# Patient Record
Sex: Male | Born: 1965
Health system: Southern US, Community
[De-identification: ages and names within clinical notes are randomized; demographics above are authoritative.]

## PROBLEM LIST (undated history)

## (undated) DIAGNOSIS — G8929 Other chronic pain: Secondary | ICD-10-CM

## (undated) DIAGNOSIS — J449 Chronic obstructive pulmonary disease, unspecified: Secondary | ICD-10-CM

## (undated) DIAGNOSIS — J439 Emphysema, unspecified: Secondary | ICD-10-CM

## (undated) DIAGNOSIS — G894 Chronic pain syndrome: Secondary | ICD-10-CM

## (undated) DIAGNOSIS — Z8701 Personal history of pneumonia (recurrent): Secondary | ICD-10-CM

## (undated) DIAGNOSIS — M1990 Unspecified osteoarthritis, unspecified site: Secondary | ICD-10-CM

## (undated) DIAGNOSIS — J42 Unspecified chronic bronchitis: Secondary | ICD-10-CM

## (undated) HISTORY — DX: Personal history of pneumonia (recurrent): Z87.01

---
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
## 2006-10-28 ENCOUNTER — Ambulatory Visit: Payer: Self-pay | Admitting: Family Medicine

## 2007-05-26 LAB — PSA: PSA: 0.6

## 2007-07-05 ENCOUNTER — Ambulatory Visit: Payer: Self-pay | Admitting: Rheumatology

## 2007-07-05 DIAGNOSIS — M5020 Other cervical disc displacement, unspecified cervical region: Secondary | ICD-10-CM | POA: Insufficient documentation

## 2007-07-05 HISTORY — PX: OTHER SURGICAL HISTORY: SHX169

## 2007-08-05 ENCOUNTER — Ambulatory Visit: Payer: Self-pay | Admitting: Vascular Surgery

## 2009-08-13 HISTORY — PX: OTHER SURGICAL HISTORY: SHX169

## 2011-09-29 LAB — LIPID PANEL
Cholesterol: 166 mg/dL (ref 0–200)
HDL: 30 mg/dL — AB (ref 35–70)
LDL Cholesterol: 87 mg/dL
Triglycerides: 246 mg/dL — AB (ref 40–160)

## 2011-09-29 LAB — HEPATIC FUNCTION PANEL: ALT: 46 U/L — AB (ref 10–40)

## 2012-07-28 ENCOUNTER — Ambulatory Visit (HOSPITAL_COMMUNITY)
Admission: RE | Admit: 2012-07-28 | Discharge: 2012-07-28 | Disposition: A | Discharge status: Home / Self Care | Payer: BC Managed Care – PPO | Source: Ambulatory Visit | Attending: Rheumatology | Admitting: Rheumatology

## 2012-07-28 ENCOUNTER — Other Ambulatory Visit (HOSPITAL_COMMUNITY): Payer: Self-pay | Admitting: Rheumatology

## 2012-07-28 DIAGNOSIS — R0602 Shortness of breath: Secondary | ICD-10-CM | POA: Insufficient documentation

## 2012-07-28 DIAGNOSIS — D869 Sarcoidosis, unspecified: Secondary | ICD-10-CM

## 2012-11-17 ENCOUNTER — Emergency Department: Payer: Self-pay | Admitting: Emergency Medicine

## 2013-11-14 ENCOUNTER — Other Ambulatory Visit: Payer: Self-pay | Admitting: Rehabilitation

## 2013-11-14 DIAGNOSIS — M25512 Pain in left shoulder: Principal | ICD-10-CM

## 2013-11-14 DIAGNOSIS — M25511 Pain in right shoulder: Secondary | ICD-10-CM

## 2013-11-23 ENCOUNTER — Ambulatory Visit
Admission: RE | Admit: 2013-11-23 | Discharge: 2013-11-23 | Disposition: A | Discharge status: Home / Self Care | Payer: BC Managed Care – PPO | Source: Ambulatory Visit | Attending: Rehabilitation | Admitting: Rehabilitation

## 2013-11-23 DIAGNOSIS — M25511 Pain in right shoulder: Secondary | ICD-10-CM

## 2013-11-23 DIAGNOSIS — M25512 Pain in left shoulder: Principal | ICD-10-CM

## 2013-12-18 ENCOUNTER — Emergency Department: Payer: Self-pay | Admitting: Emergency Medicine

## 2013-12-18 LAB — CBC
HCT: 43.6 % (ref 40.0–52.0)
HGB: 15 g/dL (ref 13.0–18.0)
MCH: 31.2 pg (ref 26.0–34.0)
MCHC: 34.3 g/dL (ref 32.0–36.0)
MCV: 91 fL (ref 80–100)
PLATELETS: 301 10*3/uL (ref 150–440)
RBC: 4.8 10*6/uL (ref 4.40–5.90)
RDW: 14.3 % (ref 11.5–14.5)
WBC: 12 10*3/uL — AB (ref 3.8–10.6)

## 2013-12-18 LAB — COMPREHENSIVE METABOLIC PANEL
ALBUMIN: 3.9 g/dL (ref 3.4–5.0)
AST: 17 U/L (ref 15–37)
Alkaline Phosphatase: 83 U/L
Anion Gap: 5 — ABNORMAL LOW (ref 7–16)
BUN: 12 mg/dL (ref 7–18)
Bilirubin,Total: 0.3 mg/dL (ref 0.2–1.0)
CALCIUM: 8.8 mg/dL (ref 8.5–10.1)
CHLORIDE: 106 mmol/L (ref 98–107)
CO2: 28 mmol/L (ref 21–32)
Creatinine: 0.79 mg/dL (ref 0.60–1.30)
EGFR (Non-African Amer.): >60
GLUCOSE: 104 mg/dL — AB (ref 65–99)
OSMOLALITY: 278 (ref 275–301)
Potassium: 3.7 mmol/L (ref 3.5–5.1)
SGPT (ALT): 33 U/L
Sodium: 139 mmol/L (ref 136–145)
Total Protein: 7.7 g/dL (ref 6.4–8.2)

## 2013-12-18 LAB — LIPASE, BLOOD: Lipase: 57 U/L — ABNORMAL LOW (ref 73–393)

## 2013-12-22 ENCOUNTER — Other Ambulatory Visit: Payer: Self-pay | Admitting: Orthopaedic Surgery

## 2013-12-22 DIAGNOSIS — M542 Cervicalgia: Secondary | ICD-10-CM

## 2014-01-03 ENCOUNTER — Ambulatory Visit
Admission: RE | Admit: 2014-01-03 | Discharge: 2014-01-03 | Disposition: A | Discharge status: Home / Self Care | Payer: BC Managed Care – PPO | Source: Ambulatory Visit | Attending: Orthopaedic Surgery | Admitting: Orthopaedic Surgery

## 2014-01-03 DIAGNOSIS — M542 Cervicalgia: Secondary | ICD-10-CM

## 2014-01-04 ENCOUNTER — Other Ambulatory Visit: Payer: BC Managed Care – PPO

## 2014-01-09 ENCOUNTER — Other Ambulatory Visit: Payer: Self-pay | Admitting: Orthopaedic Surgery

## 2014-01-09 DIAGNOSIS — M545 Low back pain: Secondary | ICD-10-CM

## 2014-01-16 ENCOUNTER — Ambulatory Visit
Admission: RE | Admit: 2014-01-16 | Discharge: 2014-01-16 | Disposition: A | Discharge status: Home / Self Care | Payer: BC Managed Care – PPO | Source: Ambulatory Visit | Attending: Orthopaedic Surgery | Admitting: Orthopaedic Surgery

## 2014-01-16 DIAGNOSIS — M545 Low back pain: Secondary | ICD-10-CM

## 2014-05-19 ENCOUNTER — Ambulatory Visit
Admit: 2014-05-19 | Disposition: A | Discharge status: Home / Self Care | Payer: Self-pay | Attending: Family Medicine | Admitting: Family Medicine

## 2014-10-26 DIAGNOSIS — F17210 Nicotine dependence, cigarettes, uncomplicated: Secondary | ICD-10-CM | POA: Insufficient documentation

## 2014-10-26 DIAGNOSIS — G4700 Insomnia, unspecified: Secondary | ICD-10-CM | POA: Insufficient documentation

## 2014-10-26 DIAGNOSIS — M545 Low back pain, unspecified: Secondary | ICD-10-CM | POA: Insufficient documentation

## 2014-10-26 DIAGNOSIS — Z720 Tobacco use: Secondary | ICD-10-CM

## 2014-10-26 DIAGNOSIS — Z8701 Personal history of pneumonia (recurrent): Secondary | ICD-10-CM

## 2014-10-26 DIAGNOSIS — D66 Hereditary factor VIII deficiency: Secondary | ICD-10-CM

## 2014-10-26 DIAGNOSIS — E782 Mixed hyperlipidemia: Secondary | ICD-10-CM

## 2014-10-26 DIAGNOSIS — K6289 Other specified diseases of anus and rectum: Secondary | ICD-10-CM

## 2014-10-26 DIAGNOSIS — R911 Solitary pulmonary nodule: Secondary | ICD-10-CM | POA: Insufficient documentation

## 2014-10-26 DIAGNOSIS — N529 Male erectile dysfunction, unspecified: Secondary | ICD-10-CM | POA: Insufficient documentation

## 2014-10-26 HISTORY — DX: Personal history of pneumonia (recurrent): Z87.01

## 2014-10-27 ENCOUNTER — Ambulatory Visit (INDEPENDENT_AMBULATORY_CARE_PROVIDER_SITE_OTHER): Payer: BLUE CROSS/BLUE SHIELD | Admitting: Family Medicine

## 2014-10-27 ENCOUNTER — Encounter: Payer: Self-pay | Admitting: Family Medicine

## 2014-10-27 ENCOUNTER — Telehealth: Payer: Self-pay

## 2014-10-27 ENCOUNTER — Other Ambulatory Visit: Payer: Self-pay | Admitting: Family Medicine

## 2014-10-27 VITALS — BP 110/60 | HR 79 | Temp 98.8°F | Resp 16 | Wt 170.0 lb

## 2014-10-27 DIAGNOSIS — R591 Generalized enlarged lymph nodes: Secondary | ICD-10-CM

## 2014-10-27 DIAGNOSIS — R911 Solitary pulmonary nodule: Secondary | ICD-10-CM | POA: Diagnosis not present

## 2014-10-27 DIAGNOSIS — Z8249 Family history of ischemic heart disease and other diseases of the circulatory system: Secondary | ICD-10-CM | POA: Insufficient documentation

## 2014-10-27 MED ORDER — LEVOFLOXACIN 500 MG PO TABS: 500.0000 mg | ORAL_TABLET | Freq: Every day | ORAL | Status: AC

## 2014-10-27 MED ORDER — AMOXICILLIN-POT CLAVULANATE 875-125 MG PO TABS: 1.0000 | ORAL_TABLET | Freq: Two times a day (BID) | ORAL | Status: DC

## 2014-10-27 MED ORDER — PROMETHAZINE HCL 25 MG PO TABS: 25.0000 mg | ORAL_TABLET | Freq: Three times a day (TID) | ORAL | Status: DC | PRN

## 2014-10-27 NOTE — Progress Notes (Signed)
Patient: Jason Gutierrez Male    DOB: October 30, 1965   49 y.o.   MRN: [id number] Visit Date: 10/27/2014  Today's Provider: Mila Merry, MD   Chief Complaint  Patient presents with  . Neck Pain   Subjective:    HPI  Patient has knot on the right front side of his neck starting 2 days ago. Knot is swollen and painful. Also has right ear pain. He had some left over amoxicillin from having tooth pulled in June which he started two days ago. He feels amoxicillin has kept it from getting worse. No other URI. No mouth pain. No fevers.   Allergies  Allergen Reactions  . Levitra [Vardenafil]   . Penicillins   . Vicodin [Hydrocodone-Acetaminophen]    Previous Medications   AMOXICILLIN (AMOXIL) 500 MG CAPSULE    Take 500 mg by mouth every 8 (eight) hours.   ATORVASTATIN (LIPITOR) 40 MG TABLET    Take 40 mg by mouth daily.   CYCLOBENZAPRINE (FLEXERIL) 5 MG TABLET    Take by mouth.   HYDROCODONE-ACETAMINOPHEN (NORCO/VICODIN) 5-325 MG PER TABLET    Take 1 tablet by mouth every 12 (twelve) hours as needed for moderate pain.   SILDENAFIL CITRATE (VIAGRA PO)    Take by mouth.   TADALAFIL (CIALIS) 20 MG TABLET    Take 20 mg by mouth daily as needed for erectile dysfunction.   ZOLPIDEM (AMBIEN) 10 MG TABLET    Take 10 mg by mouth at bedtime as needed for sleep. 0.5-1 tablet  at bed time    Review of Systems  HENT: Positive for ear pain, postnasal drip and sinus pressure. Negative for sore throat.   Cardiovascular: Negative for chest pain and palpitations.  Musculoskeletal: Positive for neck pain.  Neurological: Positive for headaches. Negative for dizziness and light-headedness.    Social History  Substance Use Topics  . Smoking status: Current Every Day Smoker  . Smokeless tobacco: Not on file  . Alcohol Use: 0.0 oz/week    0 Standard drinks or equivalent per week     Comment: occasional   Objective:   BP 110/60 mmHg  Pulse 79  Temp(Src) 98.8 F (37.1 C) (Oral)  Resp 16  Wt  170 lb (77.111 kg)  SpO2 95%  Physical Exam  General Appearance:    Alert, cooperative, no distress  HENT:   right TM fluid noted, neck has right anterior and posterior cervical nodes enlarged and tender, throat normal without erythema or exudate and sinuses nontender. No oral lesions. Gingiva pink.   Eyes:    PERRL, conjunctiva/corneas clear, EOM's intact       Lungs:     Clear to auscultation bilaterally, respirations unlabored  Heart:    Regular rate and rhythm  Neurologic:   Awake, alert, oriented x 3. No apparent focal neurological           defect.          Assessment & Plan:     1. Lymphadenopathy  - amoxicillin-clavulanate (AUGMENTIN) 875-125 MG per tablet; Take 1 tablet by mouth 2 (two) times daily.  Dispense: 20 tablet; Refill: 0 Call if symptoms change or if not rapidly improving.    2. Lung nodule-  Will be due for follow up CT scan in October.   Patient Instructions  Take 3 OTC ibuprofen (  each) every six hours for the next 3-4 days.   Call the last week of October to schedule follow chest CT scan  Lelon Huh, MD  [facility]

## 2014-10-27 NOTE — Patient Instructions (Addendum)
Take 3 OTC ibuprofen (  each) every six hours for the next 3-4 days.   Call the last week of October to schedule follow chest CT scan

## 2014-10-27 NOTE — Telephone Encounter (Signed)
Patient called saying that he was seen in the office this morning and was prescribed Augmentin. He reports that he took his first dose this morning around 9:30, and about an hr later he had diarrhea. He now reports that he has been vomiting since 12:30 today. Patient is requesting that another antibiotic be called into the pharmacy. Patient uses [facility] on [address]. Contact number is correct. Thanks!

## 2014-10-27 NOTE — Telephone Encounter (Signed)
Have sent rx for phenergan to his pharmacy for nausea. He should take antibiotics with food (something bland like bread or toast). If he is having fever or if he is not able to keep the antibiotic then he needs to go to ER as he may need antibiotic shot.

## 2014-10-27 NOTE — Telephone Encounter (Signed)
Advised patient as below. Patient reports that he may go to the ER as he is unable to keep the abx down. Advised to try phenergan as this will help with nausea.

## 2014-10-27 NOTE — Telephone Encounter (Signed)
Have sent rx for levoquin to [facility] to take instead of Augmentin.

## 2014-10-27 NOTE — Telephone Encounter (Signed)
Patient notified. Patient stated that he is still vomiting and now has cold sweats. Patient wanted to know what he can do to prevent nausea?

## 2014-10-30 NOTE — Telephone Encounter (Signed)
Please check with patient to see if he was able to take the antibiotic through the weekend and the soreness and swelling in his neck are improving. Thanks.

## 2014-10-30 NOTE — Telephone Encounter (Signed)
Levaquin is much easier on the stomach than Augmentin, and it works better than any injectable antibiotic that we have. Recommend he start taking the Levaquin.

## 2014-10-30 NOTE — Telephone Encounter (Signed)
Patient advised as below and agrees to try Levaquin. Patient states he would call back if he is unable to tolerate it.

## 2014-10-30 NOTE — Telephone Encounter (Signed)
Called patient. Patient states he did not go to the ER this weekend. He states he stopped taking the Augmentin and symptoms slowly improved. Patient only took 1 dose of the Augmentin. Patient has filled the prescription for Levaquin but has not started taking it yet, Patient is concerned that his stomach will start acting up again. Patient wants to know if there is an injection that we can give him instead of taking the oral antibiotics? Patient states he still has swelling and sorness in his neck that is unchanged. Patient states he has not had any vomiting today and he was able to eat this morning without vomiting it back up.

## 2015-02-06 ENCOUNTER — Telehealth: Payer: Self-pay | Admitting: Family Medicine

## 2015-02-06 ENCOUNTER — Telehealth: Payer: Self-pay | Admitting: *Deleted

## 2015-02-06 DIAGNOSIS — R911 Solitary pulmonary nodule: Secondary | ICD-10-CM

## 2015-02-06 NOTE — Telephone Encounter (Signed)
Patient called office requesting medication for persistent cough and sinus drainage. States no fever or sore throat. Patient took some amoxicillin he had at home and had 5 hour bout of vomiting and diarrhea. Advised pt that amoxicillin is in his allergy list, causing the above reactions. Patient stated that he will throw amoxicillin away. Vomiting and diarrhea have resolved.

## 2015-02-06 NOTE — Telephone Encounter (Signed)
Please advise patient he is due to repeat chest CT due to small nodule seen in right lung on CT in April. Order has been sent to [facility] to schedule. Thanks.

## 2015-02-06 NOTE — Telephone Encounter (Signed)
-----   Message from Malva Limesonald E Brooklee Michelin, MD sent at 01/30/2015  8:11 AM EST ----- Regarding: Need repeat chest CT after October 2016 Follow up new 5mm RML ground-glass opacity on CT from 05/19/14.

## 2015-02-06 NOTE — Telephone Encounter (Signed)
Advised patient as below.  

## 2015-02-07 NOTE — Telephone Encounter (Signed)
Can take OTC Mucinex DM for cough and fexofenadine for drainage. O.v. If any fever above 101, shortness of breath, chest pain, or if symptoms last more than 10 days.

## 2015-02-07 NOTE — Telephone Encounter (Signed)
Patient was notified. Expressed understanding.  

## 2015-02-12 ENCOUNTER — Ambulatory Visit: Payer: BLUE CROSS/BLUE SHIELD

## 2015-02-14 ENCOUNTER — Ambulatory Visit: Admission: RE | Admit: 2015-02-14 | Payer: BLUE CROSS/BLUE SHIELD | Source: Ambulatory Visit

## 2015-02-14 ENCOUNTER — Ambulatory Visit: Payer: BLUE CROSS/BLUE SHIELD | Attending: Family Medicine

## 2015-04-18 ENCOUNTER — Other Ambulatory Visit: Payer: Self-pay | Admitting: Family Medicine

## 2015-05-10 DIAGNOSIS — M5106 Intervertebral disc disorders with myelopathy, lumbar region: Secondary | ICD-10-CM | POA: Diagnosis not present

## 2015-05-10 DIAGNOSIS — M545 Low back pain: Secondary | ICD-10-CM | POA: Diagnosis not present

## 2015-05-10 DIAGNOSIS — G894 Chronic pain syndrome: Secondary | ICD-10-CM | POA: Diagnosis not present

## 2015-05-10 DIAGNOSIS — M542 Cervicalgia: Secondary | ICD-10-CM | POA: Diagnosis not present

## 2015-06-07 DIAGNOSIS — G894 Chronic pain syndrome: Secondary | ICD-10-CM | POA: Diagnosis not present

## 2015-06-07 DIAGNOSIS — M545 Low back pain: Secondary | ICD-10-CM | POA: Diagnosis not present

## 2015-07-06 DIAGNOSIS — M545 Low back pain: Secondary | ICD-10-CM | POA: Diagnosis not present

## 2015-07-06 DIAGNOSIS — G894 Chronic pain syndrome: Secondary | ICD-10-CM | POA: Diagnosis not present

## 2015-08-03 DIAGNOSIS — Z6830 Body mass index (BMI) 30.0-30.9, adult: Secondary | ICD-10-CM | POA: Diagnosis not present

## 2015-08-03 DIAGNOSIS — G894 Chronic pain syndrome: Secondary | ICD-10-CM | POA: Diagnosis not present

## 2015-09-03 DIAGNOSIS — M791 Myalgia: Secondary | ICD-10-CM | POA: Diagnosis not present

## 2015-09-03 DIAGNOSIS — G894 Chronic pain syndrome: Secondary | ICD-10-CM | POA: Diagnosis not present

## 2015-10-02 DIAGNOSIS — M791 Myalgia: Secondary | ICD-10-CM | POA: Diagnosis not present

## 2015-10-02 DIAGNOSIS — M545 Low back pain: Secondary | ICD-10-CM | POA: Diagnosis not present

## 2015-10-02 DIAGNOSIS — G894 Chronic pain syndrome: Secondary | ICD-10-CM | POA: Diagnosis not present

## 2015-10-30 DIAGNOSIS — M791 Myalgia: Secondary | ICD-10-CM | POA: Diagnosis not present

## 2015-10-30 DIAGNOSIS — G894 Chronic pain syndrome: Secondary | ICD-10-CM | POA: Diagnosis not present

## 2015-10-30 DIAGNOSIS — M542 Cervicalgia: Secondary | ICD-10-CM | POA: Diagnosis not present

## 2015-11-27 DIAGNOSIS — G894 Chronic pain syndrome: Secondary | ICD-10-CM | POA: Diagnosis not present

## 2016-01-08 DIAGNOSIS — M545 Low back pain: Secondary | ICD-10-CM | POA: Diagnosis not present

## 2016-01-08 DIAGNOSIS — M4716 Other spondylosis with myelopathy, lumbar region: Secondary | ICD-10-CM | POA: Diagnosis not present

## 2016-01-08 DIAGNOSIS — G894 Chronic pain syndrome: Secondary | ICD-10-CM | POA: Diagnosis not present

## 2016-01-08 DIAGNOSIS — M791 Myalgia: Secondary | ICD-10-CM | POA: Diagnosis not present

## 2016-02-15 DIAGNOSIS — M791 Myalgia: Secondary | ICD-10-CM | POA: Diagnosis not present

## 2016-02-15 DIAGNOSIS — M542 Cervicalgia: Secondary | ICD-10-CM | POA: Diagnosis not present

## 2016-02-15 DIAGNOSIS — G894 Chronic pain syndrome: Secondary | ICD-10-CM | POA: Diagnosis not present

## 2016-02-15 DIAGNOSIS — M545 Low back pain: Secondary | ICD-10-CM | POA: Diagnosis not present

## 2016-03-14 DIAGNOSIS — M4716 Other spondylosis with myelopathy, lumbar region: Secondary | ICD-10-CM | POA: Diagnosis not present

## 2016-03-14 DIAGNOSIS — M545 Low back pain: Secondary | ICD-10-CM | POA: Diagnosis not present

## 2016-03-14 DIAGNOSIS — G894 Chronic pain syndrome: Secondary | ICD-10-CM | POA: Diagnosis not present

## 2016-03-14 DIAGNOSIS — Z79899 Other long term (current) drug therapy: Secondary | ICD-10-CM | POA: Diagnosis not present

## 2016-03-14 DIAGNOSIS — M47817 Spondylosis without myelopathy or radiculopathy, lumbosacral region: Secondary | ICD-10-CM | POA: Diagnosis not present

## 2016-04-11 DIAGNOSIS — M791 Myalgia: Secondary | ICD-10-CM | POA: Diagnosis not present

## 2016-04-11 DIAGNOSIS — G894 Chronic pain syndrome: Secondary | ICD-10-CM | POA: Diagnosis not present

## 2016-05-08 ENCOUNTER — Other Ambulatory Visit: Payer: Self-pay | Admitting: Family Medicine

## 2016-05-08 DIAGNOSIS — M545 Low back pain: Secondary | ICD-10-CM | POA: Diagnosis not present

## 2016-05-08 DIAGNOSIS — G894 Chronic pain syndrome: Secondary | ICD-10-CM | POA: Diagnosis not present

## 2016-05-08 DIAGNOSIS — M4716 Other spondylosis with myelopathy, lumbar region: Secondary | ICD-10-CM | POA: Diagnosis not present

## 2016-05-08 DIAGNOSIS — M542 Cervicalgia: Secondary | ICD-10-CM | POA: Diagnosis not present

## 2016-06-05 DIAGNOSIS — M5106 Intervertebral disc disorders with myelopathy, lumbar region: Secondary | ICD-10-CM | POA: Diagnosis not present

## 2016-06-05 DIAGNOSIS — M791 Myalgia: Secondary | ICD-10-CM | POA: Diagnosis not present

## 2016-06-05 DIAGNOSIS — G894 Chronic pain syndrome: Secondary | ICD-10-CM | POA: Diagnosis not present

## 2016-06-05 DIAGNOSIS — M4716 Other spondylosis with myelopathy, lumbar region: Secondary | ICD-10-CM | POA: Diagnosis not present

## 2016-07-03 DIAGNOSIS — G894 Chronic pain syndrome: Secondary | ICD-10-CM | POA: Diagnosis not present

## 2016-07-03 DIAGNOSIS — M4716 Other spondylosis with myelopathy, lumbar region: Secondary | ICD-10-CM | POA: Diagnosis not present

## 2016-07-29 DIAGNOSIS — M4716 Other spondylosis with myelopathy, lumbar region: Secondary | ICD-10-CM | POA: Diagnosis not present

## 2016-07-29 DIAGNOSIS — M791 Myalgia: Secondary | ICD-10-CM | POA: Diagnosis not present

## 2016-07-29 DIAGNOSIS — G894 Chronic pain syndrome: Secondary | ICD-10-CM | POA: Diagnosis not present

## 2016-08-26 DIAGNOSIS — M791 Myalgia: Secondary | ICD-10-CM | POA: Diagnosis not present

## 2016-08-26 DIAGNOSIS — M542 Cervicalgia: Secondary | ICD-10-CM | POA: Diagnosis not present

## 2016-08-26 DIAGNOSIS — G894 Chronic pain syndrome: Secondary | ICD-10-CM | POA: Diagnosis not present

## 2016-08-26 DIAGNOSIS — M47817 Spondylosis without myelopathy or radiculopathy, lumbosacral region: Secondary | ICD-10-CM | POA: Diagnosis not present

## 2016-09-11 ENCOUNTER — Other Ambulatory Visit: Payer: Self-pay | Admitting: Family Medicine

## 2016-09-12 NOTE — Telephone Encounter (Signed)
See refill request.

## 2016-10-07 DIAGNOSIS — Z79899 Other long term (current) drug therapy: Secondary | ICD-10-CM | POA: Diagnosis not present

## 2016-10-07 DIAGNOSIS — G894 Chronic pain syndrome: Secondary | ICD-10-CM | POA: Diagnosis not present

## 2016-10-07 DIAGNOSIS — M545 Low back pain: Secondary | ICD-10-CM | POA: Diagnosis not present

## 2016-10-07 DIAGNOSIS — M791 Myalgia: Secondary | ICD-10-CM | POA: Diagnosis not present

## 2016-11-04 DIAGNOSIS — M545 Low back pain: Secondary | ICD-10-CM | POA: Diagnosis not present

## 2016-11-04 DIAGNOSIS — G894 Chronic pain syndrome: Secondary | ICD-10-CM | POA: Diagnosis not present

## 2016-12-01 DIAGNOSIS — G894 Chronic pain syndrome: Secondary | ICD-10-CM | POA: Diagnosis not present

## 2016-12-01 DIAGNOSIS — M5106 Intervertebral disc disorders with myelopathy, lumbar region: Secondary | ICD-10-CM | POA: Diagnosis not present

## 2016-12-01 DIAGNOSIS — M4726 Other spondylosis with radiculopathy, lumbar region: Secondary | ICD-10-CM | POA: Diagnosis not present

## 2016-12-26 IMAGING — CT CT CHEST W/O CM
2 of 3 series · 15 of 36 positions shown, 18 images · non-contrast
Comparison: 12/18/2013

CLINICAL DATA: Followup right middle lobe and left upper lobe
pulmonary nodules.

EXAM:
CT CHEST WITHOUT CONTRAST
TECHNIQUE: Multidetector CT imaging of the chest was performed following the
standard protocol without IV contrast..

[Series 2: routine chest wo · axial · 0.72mm/px · z∈[-633,-388]mm · 12 of 59 slices shown, 15 images]
[im 5/59  mediastinal]
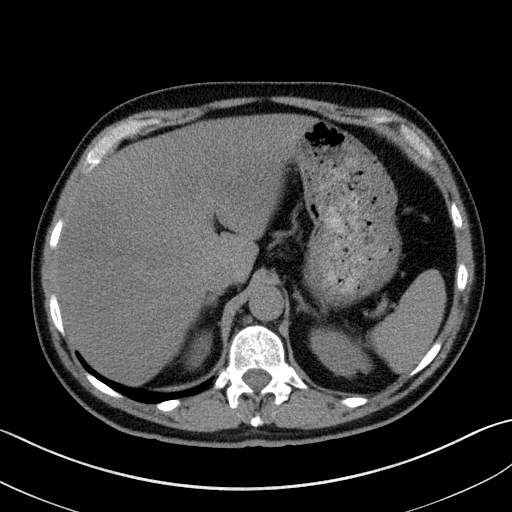
[im 5/59  lung]
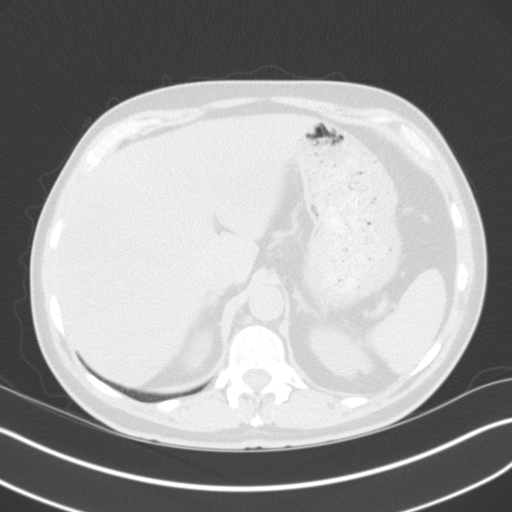
[im 9/59  lung]
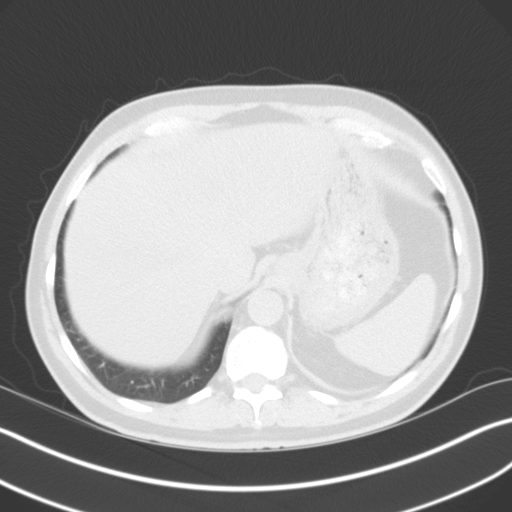
[im 13/59  lung]
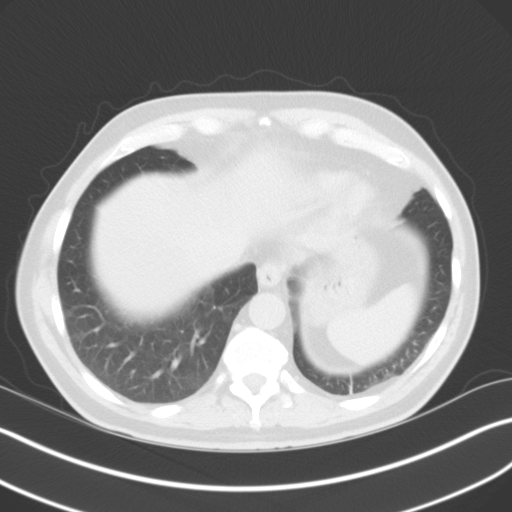
[im 18/59  lung]
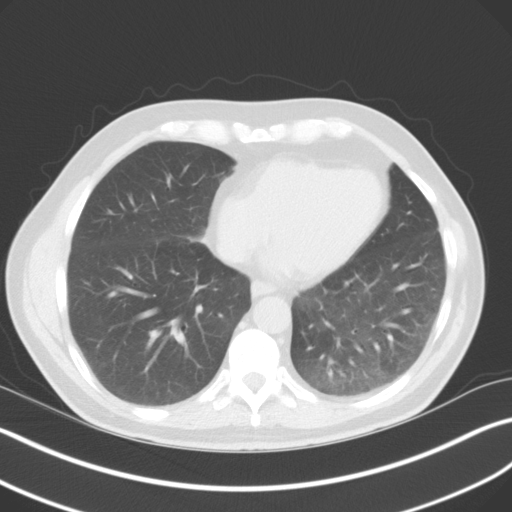
[im 22/59  mediastinal]
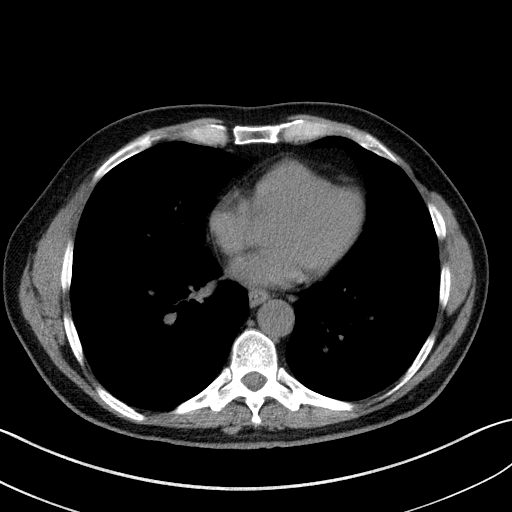
[im 22/59  lung]
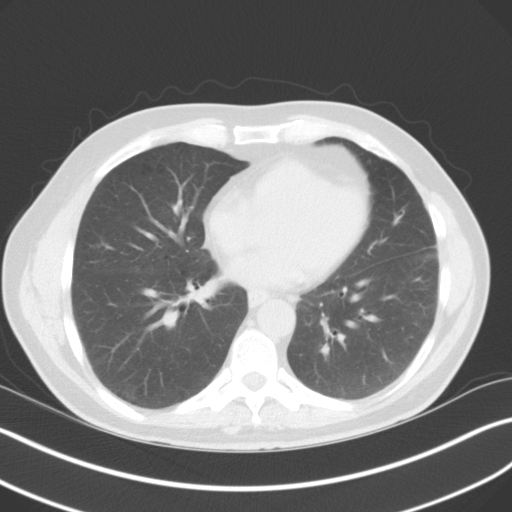
[im 26/59  lung]
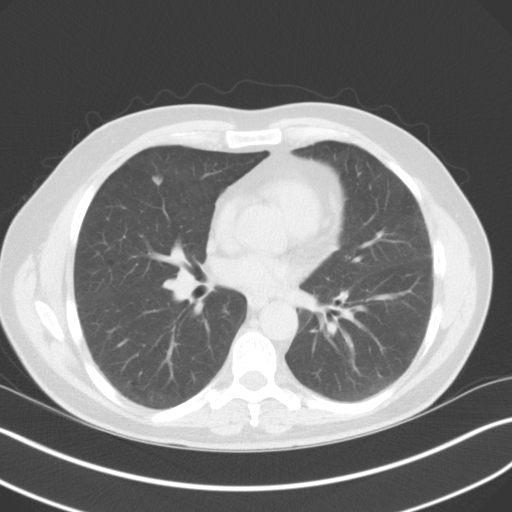
[im 33/59  lung]
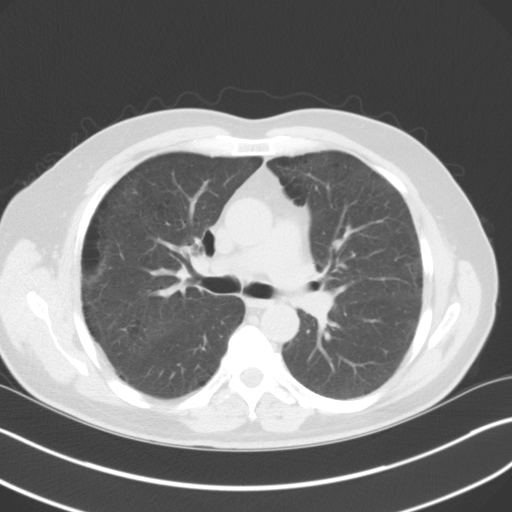
[im 37/59  lung]
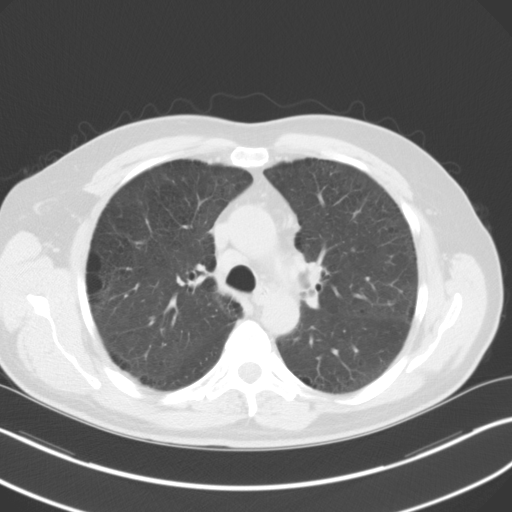
[im 41/59  mediastinal]
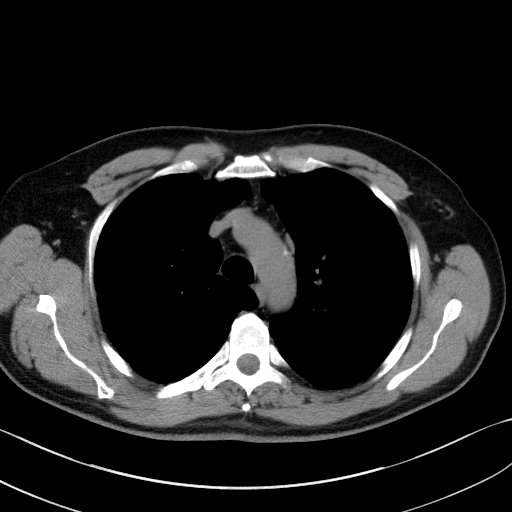
[im 41/59  lung]
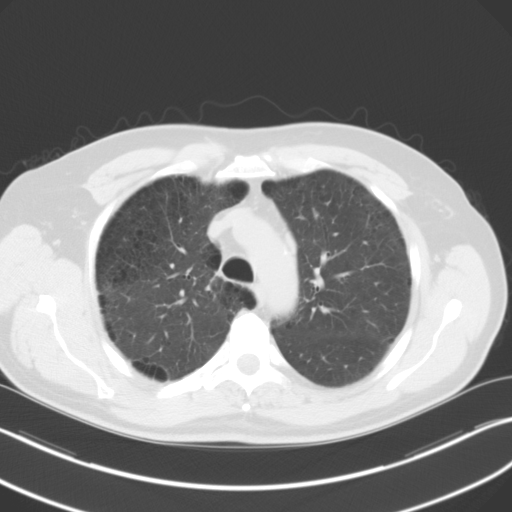
[im 46/59  lung]
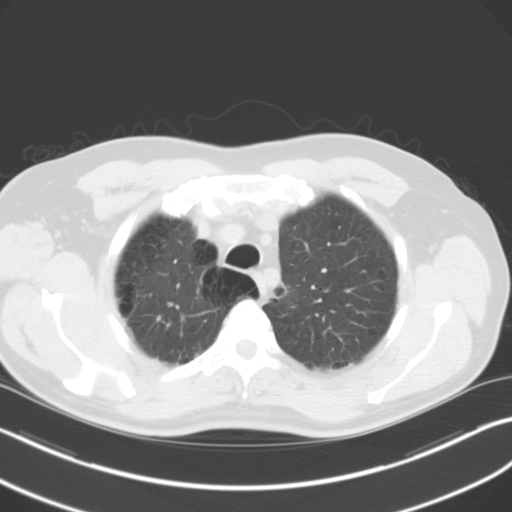
[im 50/59  lung]
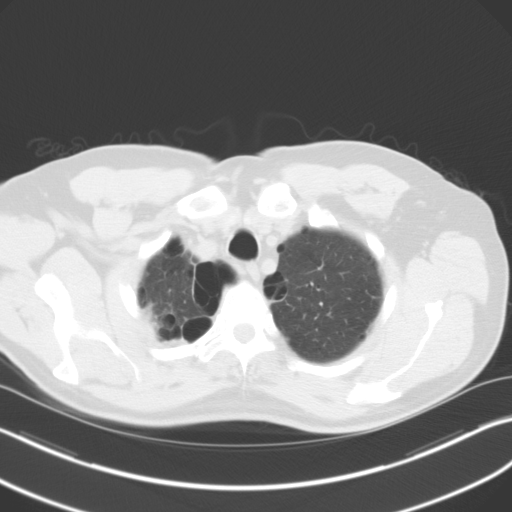
[im 54/59  lung]
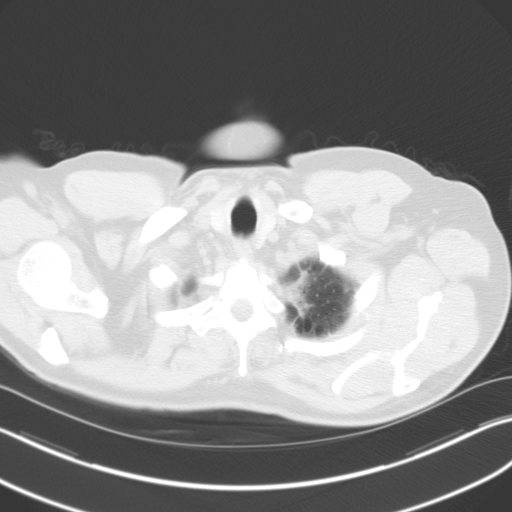

[Series 5: cor routine chest wo · coronal · 0.59mm/px · 3 of 136 slices shown]
[im 28/136  lung]
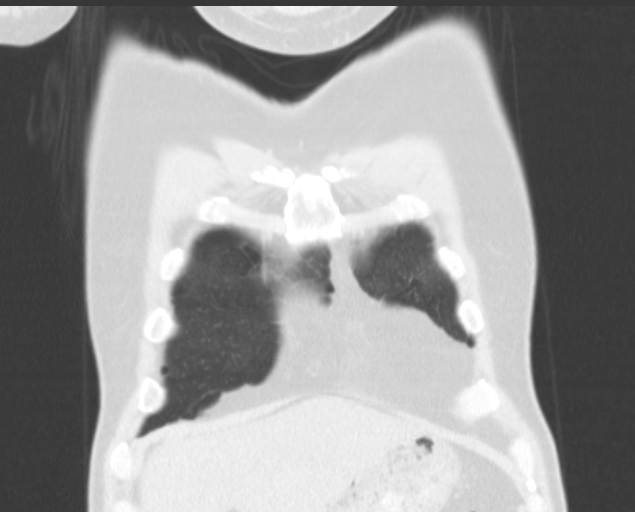
[im 55/136  lung]
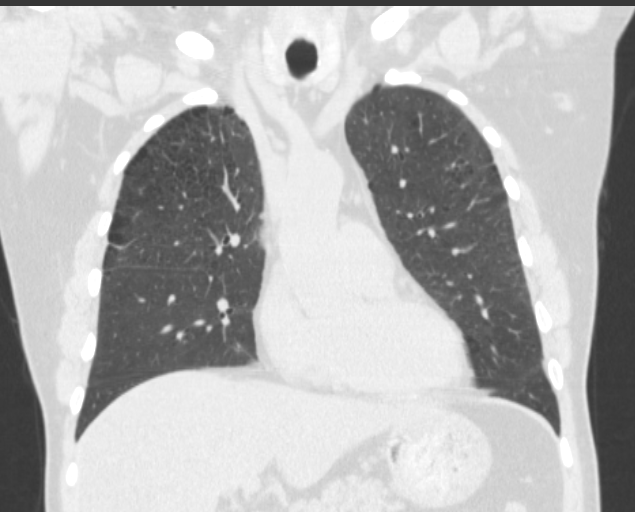
[im 82/136  lung]
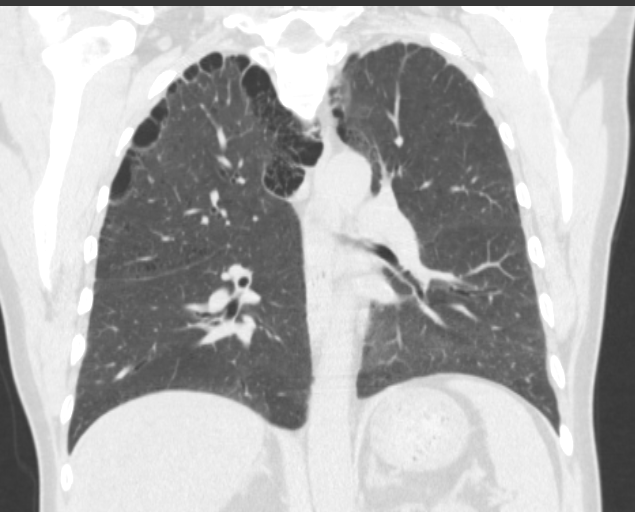

[15 of 36 positions shown; findings below may reference images not displayed]

FINDINGS: Mediastinum/Nodes: Thyroid appears normal. Subjectively prominent AP
window and pretracheal nodes are stable, dominant pretracheal node
measuring 0.9 cm in short axis diameter image 24. Great vessels are
normal in caliber. No hilar lymphadenopathy.

Lungs/Pleura: No persistent nodule is identified in the left upper
lobe to correspond with the previously seen finding. Areas of
biapical pleural thickening/scarring are stable. Paraseptal
emphysematous change reidentified. 3 mm right upper lobe triangular
nodule image 28 abutting the minor fissure is new since the prior
exam, possibly an intrapulmonary lymph node. More inferiorly,
abutting the minor fissure, is a triangular-shaped 0.7 cm nodule
likely also representing an intrapulmonary lymph node image 34,
smaller than previously. Right middle lobe subpleural 5 mm
ground-glass nodular opacity image 36 is not definitely identified
on the prior exam allowing for differences in technique. Curvilinear
left lower lobe scarring or atelectasis is noted. No new focal
parenchymal consolidation or mass. No pleural effusion.

Upper abdomen: Adrenal glands appear normal in their visualized
aspects.

Musculoskeletal: No acute osseous abnormality.
IMPRESSION: Decrease in size of probable intrapulmonary lymph node abutting the
minor fissure.

Apparent interval resolution of previously seen nodule of the left
upper lobe.

5 mm right middle lobe ground-glass nodular opacity, possibly new
from the prior exam allowing for differences in technique. Followup
chest CT is recommended in 6 months.

Paraseptal emphysematous change reidentified.

## 2016-12-30 DIAGNOSIS — G894 Chronic pain syndrome: Secondary | ICD-10-CM | POA: Diagnosis not present

## 2016-12-30 DIAGNOSIS — M545 Low back pain: Secondary | ICD-10-CM | POA: Diagnosis not present

## 2016-12-30 DIAGNOSIS — M542 Cervicalgia: Secondary | ICD-10-CM | POA: Diagnosis not present

## 2017-02-04 ENCOUNTER — Other Ambulatory Visit: Payer: Self-pay | Admitting: Family Medicine

## 2017-02-04 NOTE — Telephone Encounter (Signed)
Pharmacy requesting refills. Thanks!  

## 2017-02-06 DIAGNOSIS — I10 Essential (primary) hypertension: Secondary | ICD-10-CM | POA: Diagnosis not present

## 2017-02-06 DIAGNOSIS — M542 Cervicalgia: Secondary | ICD-10-CM | POA: Diagnosis not present

## 2017-02-06 DIAGNOSIS — G894 Chronic pain syndrome: Secondary | ICD-10-CM | POA: Diagnosis not present

## 2017-02-06 DIAGNOSIS — Z6824 Body mass index (BMI) 24.0-24.9, adult: Secondary | ICD-10-CM | POA: Diagnosis not present

## 2017-02-16 ENCOUNTER — Encounter: Payer: Self-pay | Admitting: Family Medicine

## 2017-02-16 ENCOUNTER — Ambulatory Visit: Payer: BLUE CROSS/BLUE SHIELD | Admitting: Family Medicine

## 2017-02-16 VITALS — BP 120/82 | HR 88 | Temp 98.5°F | Resp 16 | Wt 177.0 lb

## 2017-02-16 DIAGNOSIS — J0100 Acute maxillary sinusitis, unspecified: Secondary | ICD-10-CM

## 2017-02-16 MED ORDER — SILDENAFIL CITRATE 100 MG PO TABS: ORAL_TABLET | ORAL | 5 refills | Status: DC

## 2017-02-16 MED ORDER — AZITHROMYCIN 250 MG PO TABS: ORAL_TABLET | ORAL | 0 refills | Status: AC

## 2017-02-16 NOTE — Progress Notes (Signed)
Patient: Jason Gutierrez Male    DOB: October 10, 1965   52 y.o.   MRN: [id number] Visit Date: 02/16/2017  Today's Provider: Mila Merry, MD   Chief Complaint  Patient presents with  . URI   Subjective:    URI   This is a new problem. Episode onset: 2 days ago. The problem has been gradually worsening. There has been no fever. Associated symptoms include congestion (in sinus and chest), coughing, rhinorrhea, sinus pain and sneezing. Pertinent negatives include no abdominal pain, chest pain, ear pain, nausea, plugged ear sensation, sore throat or vomiting. Treatments tried: OTC Sinus decongestant. The treatment provided mild relief.  Patient states that several people at work have been sick with cold symptoms.      Allergies  Allergen Reactions  . Augmentin [Amoxicillin-Pot Clavulanate] Diarrhea and Nausea And Vomiting    Has no adverse reaction from amoxicillin  . Levitra [Vardenafil]   . Penicillins   . Vicodin [Hydrocodone-Acetaminophen]      Current Outpatient Medications:  .  cyclobenzaprine (FLEXERIL) 5 MG tablet, Take by mouth., Disp: , Rfl:  .  HYDROcodone-acetaminophen (NORCO) 7.5-325 MG tablet, Take 1 tablet by mouth every 6 (six) hours as needed. For pain, Disp: , Rfl: 0 .  meloxicam (MOBIC) 15 MG tablet, Take 1 tablet by mouth daily., Disp: , Rfl: 5 .  sildenafil (VIAGRA) 100 MG tablet, TAKE 1 TABLET BY MOUTH AS NEEDED. MAX 1 PER DAY, Disp: 8 tablet, Rfl: 0 .  Sildenafil Citrate (VIAGRA PO), Take by mouth., Disp: , Rfl:  .  tadalafil (CIALIS) 20 MG tablet, Take 20 mg by mouth daily as needed for erectile dysfunction., Disp: , Rfl:  .  zolpidem (AMBIEN) 10 MG tablet, Take 10 mg by mouth at bedtime as needed for sleep. 0.5-1 tablet  at bed time, Disp: , Rfl:  .  atorvastatin (LIPITOR) 40 MG tablet, Take 40 mg by mouth daily., Disp: , Rfl:  .  promethazine (PHENERGAN) 25 MG tablet, Take 1 tablet (25 mg total) by mouth every 8 (eight) hours as needed for nausea or  vomiting., Disp: 10 tablet, Rfl: 0  Review of Systems  Constitutional: Positive for fatigue. Negative for appetite change, chills, diaphoresis and fever.  HENT: Positive for congestion (in sinus and chest), postnasal drip, rhinorrhea, sinus pressure, sinus pain and sneezing. Negative for ear pain and sore throat.   Respiratory: Positive for cough. Negative for chest tightness and shortness of breath.   Cardiovascular: Negative for chest pain and palpitations.  Gastrointestinal: Negative for abdominal pain, nausea and vomiting.    Social History   Tobacco Use  . Smoking status: Current Every Day Smoker    Packs/day: 1.50  . Smokeless tobacco: Never Used  Substance Use Topics  . Alcohol use: Yes    Alcohol/week: 0.0 oz    Comment: 1 beer weekly   Objective:   BP 120/82 (BP Location: Right Arm, Patient Position: Sitting, Cuff Size: Large)   Pulse 88   Temp 98.5 F (36.9 C) (Oral)   Resp 16   Wt 177 lb (80.3 kg)   SpO2 95% Comment: room air  BMI 27.11 kg/m     Physical Exam   General Appearance:    Alert, cooperative, no distress  HENT:   bilateral TM normal without fluid or infection, neck without nodes, frontal and maxillary sinus tenderness and nasal mucosa pale and congested  Eyes:    PERRL, conjunctiva/corneas clear, EOM's intact  Lungs:     Clear to auscultation bilaterally, respirations unlabored  Heart:    Regular rate and rhythm  Neurologic:   Awake, alert, oriented x 3. No apparent focal neurological           defect.           Assessment & Plan:     There are no diagnoses linked to this encounter.       Mila Merryonald Fisher, MD  [facility]

## 2017-03-09 DIAGNOSIS — G894 Chronic pain syndrome: Secondary | ICD-10-CM | POA: Diagnosis not present

## 2017-03-09 DIAGNOSIS — M542 Cervicalgia: Secondary | ICD-10-CM | POA: Diagnosis not present

## 2017-03-09 DIAGNOSIS — M545 Low back pain: Secondary | ICD-10-CM | POA: Diagnosis not present

## 2017-03-23 DIAGNOSIS — H5211 Myopia, right eye: Secondary | ICD-10-CM | POA: Diagnosis not present

## 2017-03-23 DIAGNOSIS — H5213 Myopia, bilateral: Secondary | ICD-10-CM | POA: Diagnosis not present

## 2017-03-23 DIAGNOSIS — H524 Presbyopia: Secondary | ICD-10-CM | POA: Diagnosis not present

## 2017-04-06 DIAGNOSIS — M542 Cervicalgia: Secondary | ICD-10-CM | POA: Diagnosis not present

## 2017-04-06 DIAGNOSIS — M546 Pain in thoracic spine: Secondary | ICD-10-CM | POA: Diagnosis not present

## 2017-04-06 DIAGNOSIS — M791 Myalgia, unspecified site: Secondary | ICD-10-CM | POA: Diagnosis not present

## 2017-04-06 DIAGNOSIS — G894 Chronic pain syndrome: Secondary | ICD-10-CM | POA: Diagnosis not present

## 2017-04-06 DIAGNOSIS — Z79899 Other long term (current) drug therapy: Secondary | ICD-10-CM | POA: Diagnosis not present

## 2017-05-04 DIAGNOSIS — G894 Chronic pain syndrome: Secondary | ICD-10-CM | POA: Diagnosis not present

## 2017-05-04 DIAGNOSIS — M5030 Other cervical disc degeneration, unspecified cervical region: Secondary | ICD-10-CM | POA: Diagnosis not present

## 2017-05-04 DIAGNOSIS — M47896 Other spondylosis, lumbar region: Secondary | ICD-10-CM | POA: Diagnosis not present

## 2017-06-02 DIAGNOSIS — M5030 Other cervical disc degeneration, unspecified cervical region: Secondary | ICD-10-CM | POA: Diagnosis not present

## 2017-06-02 DIAGNOSIS — G894 Chronic pain syndrome: Secondary | ICD-10-CM | POA: Diagnosis not present

## 2017-06-02 DIAGNOSIS — M47896 Other spondylosis, lumbar region: Secondary | ICD-10-CM | POA: Diagnosis not present

## 2017-06-30 DIAGNOSIS — G894 Chronic pain syndrome: Secondary | ICD-10-CM | POA: Diagnosis not present

## 2017-06-30 DIAGNOSIS — M4726 Other spondylosis with radiculopathy, lumbar region: Secondary | ICD-10-CM | POA: Diagnosis not present

## 2017-06-30 DIAGNOSIS — M545 Low back pain: Secondary | ICD-10-CM | POA: Diagnosis not present

## 2017-07-22 ENCOUNTER — Ambulatory Visit
Admission: RE | Admit: 2017-07-22 | Discharge: 2017-07-22 | Disposition: A | Discharge status: Home / Self Care | Payer: BLUE CROSS/BLUE SHIELD | Source: Ambulatory Visit | Attending: Physician Assistant | Admitting: Physician Assistant

## 2017-07-22 ENCOUNTER — Ambulatory Visit: Payer: BLUE CROSS/BLUE SHIELD | Admitting: Physician Assistant

## 2017-07-22 ENCOUNTER — Encounter: Payer: Self-pay | Admitting: Physician Assistant

## 2017-07-22 VITALS — BP 116/78 | HR 96 | Temp 98.5°F | Resp 16 | Wt 172.0 lb

## 2017-07-22 DIAGNOSIS — R509 Fever, unspecified: Secondary | ICD-10-CM

## 2017-07-22 DIAGNOSIS — J439 Emphysema, unspecified: Secondary | ICD-10-CM

## 2017-07-22 DIAGNOSIS — J849 Interstitial pulmonary disease, unspecified: Secondary | ICD-10-CM | POA: Insufficient documentation

## 2017-07-22 DIAGNOSIS — R05 Cough: Secondary | ICD-10-CM | POA: Diagnosis not present

## 2017-07-22 DIAGNOSIS — R918 Other nonspecific abnormal finding of lung field: Secondary | ICD-10-CM | POA: Diagnosis not present

## 2017-07-22 DIAGNOSIS — F17200 Nicotine dependence, unspecified, uncomplicated: Secondary | ICD-10-CM | POA: Insufficient documentation

## 2017-07-22 DIAGNOSIS — R911 Solitary pulmonary nodule: Secondary | ICD-10-CM | POA: Diagnosis not present

## 2017-07-22 DIAGNOSIS — J441 Chronic obstructive pulmonary disease with (acute) exacerbation: Secondary | ICD-10-CM | POA: Diagnosis not present

## 2017-07-22 DIAGNOSIS — R110 Nausea: Secondary | ICD-10-CM

## 2017-07-22 DIAGNOSIS — J449 Chronic obstructive pulmonary disease, unspecified: Secondary | ICD-10-CM | POA: Insufficient documentation

## 2017-07-22 DIAGNOSIS — R059 Cough, unspecified: Secondary | ICD-10-CM

## 2017-07-22 MED ORDER — ONDANSETRON HCL 4 MG PO TABS: 4.0000 mg | ORAL_TABLET | Freq: Three times a day (TID) | ORAL | 0 refills | Status: DC | PRN

## 2017-07-22 MED ORDER — DOXYCYCLINE HYCLATE 100 MG PO TABS: 100.0000 mg | ORAL_TABLET | Freq: Two times a day (BID) | ORAL | 0 refills | Status: AC

## 2017-07-22 MED ORDER — ALBUTEROL SULFATE HFA 108 (90 BASE) MCG/ACT IN AERS
2.0000 | INHALATION_SPRAY | Freq: Four times a day (QID) | RESPIRATORY_TRACT | 2 refills | Status: DC | PRN
Start: 2017-07-22 — End: 2019-02-17

## 2017-07-22 NOTE — Patient Instructions (Signed)
Chronic Obstructive Pulmonary Disease Exacerbation  Chronic obstructive pulmonary disease (COPD) is a common lung problem. In COPD, the flow of air from the lungs is limited. COPD exacerbations are times that breathing gets worse and you need extra treatment. Without treatment they can be life threatening. If they happen often, your lungs can become more damaged. If your COPD gets worse, your doctor may treat you with:  ? Medicines.  ? Oxygen.  ? Different ways to clear your airway, such as using a mask.    Follow these instructions at home:  ? Do not smoke.  ? Avoid tobacco smoke and other things that bother your lungs.  ? If given, take your antibiotic medicine as told. Finish the medicine even if you start to feel better.  ? Only take medicines as told by your doctor.  ? Drink enough fluids to keep your pee (urine) clear or pale yellow (unless your doctor has told you not to).  ? Use a cool mist machine (vaporizer).  ? If you use oxygen or a machine that turns liquid medicine into a mist (nebulizer), continue to use them as told.  ? Keep up with shots (vaccinations) as told by your doctor.  ? Exercise regularly.  ? Eat healthy foods.  ? Keep all doctor visits as told.  Get help right away if:  ? You are very short of breath and it gets worse.  ? You have trouble talking.  ? You have bad chest pain.  ? You have blood in your spit (sputum).  ? You have a fever.  ? You keep throwing up (vomiting).  ? You feel weak, or you pass out (faint).  ? You feel confused.  ? You keep getting worse.  This information is not intended to replace advice given to you by your health care provider. Make sure you discuss any questions you have with your health care provider.  Document Released: 01/09/2011 Document Revised: 06/28/2015 Document Reviewed: 09/24/2012  Elsevier Interactive Patient Education ? 2017 [employer] Inc[employer]

## 2017-07-22 NOTE — Progress Notes (Signed)
[facility]  Chief Complaint  Patient presents with  . URI    Symptoms started Saturday    Subjective:    Patient ID: Jason Gutierrez, male    DOB: 11-28-1965, 52 y.o.   MRN: [id number]  Upper Respiratory Infection: Jason Gutierrez is a 52 y.o. male  complaining of symptoms of a URI, possible sinusitis. Symptoms include left ear pain, congestion, cough, fever and sore throat. Onset of symptoms was 3 days ago, gradually worsening since that time. He also c/o achiness, cough described as productive, fever 102 was the highest., nasal congestion, shortness of breath, sinus pressure and sore throat for the past 3 days .  He is drinking plenty of fluids. Evaluation to date: Pt called the on call nurse line and was prescribed Zpac. Treatment to date: antibiotics and decongestants. The treatment has provided no.   Started smoking at 52 years old. Has history of pulmonary nodule on Chest CT that needs reimaging.   Review of Systems  Constitutional: Positive for chills, diaphoresis, fatigue and fever (Temp as high as 102). Negative for activity change, appetite change and unexpected weight change.  HENT: Positive for congestion, ear pain, sinus pressure, sinus pain and sore throat. Negative for ear discharge, nosebleeds, postnasal drip, rhinorrhea and sneezing.   Respiratory: Positive for cough, chest tightness, shortness of breath and wheezing. Negative for apnea, choking and stridor.   Gastrointestinal: Negative.   Neurological: Negative for dizziness, light-headedness and headaches.       Objective:   BP 116/78 (BP Location: Right Arm, Patient Position: Sitting, Cuff Size: Normal)   Pulse 96   Temp 98.5 F (36.9 C) (Oral)   Resp 16   Wt 172 lb (78 kg)   SpO2 95%   BMI 26.35 kg/m   Patient Active Problem List   Diagnosis Date Noted  . Family history of early CAD 10/27/2014  . Rectal cyst 10/26/2014  . Hemophilia (HCC) 10/26/2014  . Mixed  hyperlipidemia 10/26/2014  . Tobacco abuse 10/26/2014  . Insomnia 10/26/2014  . Low back pain 10/26/2014  . Erectile dysfunction 10/26/2014  . Lung nodule 10/26/2014  . Herniated cervical disc 07/05/2007    Outpatient Encounter Medications as of 07/22/2017  Medication Sig  . cyclobenzaprine (FLEXERIL) 5 MG tablet Take by mouth.  Marland Kitchen. HYDROcodone-acetaminophen (NORCO) 7.5-325 MG tablet Take 1 tablet by mouth every 6 (six) hours as needed. For pain  . meloxicam (MOBIC) 15 MG tablet Take 1 tablet by mouth daily.  . sildenafil (VIAGRA) 100 MG tablet TAKE 1 TABLET BY MOUTH AS NEEDED. MAX 1 PER DAY  . atorvastatin (LIPITOR) 40 MG tablet Take 40 mg by mouth daily.  . tadalafil (CIALIS) 20 MG tablet Take 20 mg by mouth daily as needed for erectile dysfunction.  Marland Kitchen. zolpidem (AMBIEN) 10 MG tablet Take 10 mg by mouth at bedtime as needed for sleep. 0.5-1 tablet  at bed time   No facility-administered encounter medications on file as of 07/22/2017.     Allergies  Allergen Reactions  . Augmentin [Amoxicillin-Pot Clavulanate] Diarrhea and Nausea And Vomiting    Has no adverse reaction from amoxicillin  . Levitra [Vardenafil]   . Penicillins   . Vicodin [Hydrocodone-Acetaminophen]        Physical Exam  Constitutional: He is oriented to person, place, and time. He appears well-developed and well-nourished.  Cardiovascular: Normal rate and regular rhythm.  Pulmonary/Chest: Effort normal. He has wheezes.  Neurological: He is alert and oriented to person,  place, and time.  Skin: Skin is warm and dry.  Psychiatric: He has a normal mood and affect. His behavior is normal.       Assessment & Plan:  1. COPD exacerbation (HCC)  - DG Chest 2 View; Future - doxycycline (VIBRA-TABS) 100 MG tablet; Take 1 tablet (100 mg total) by mouth 2 (two) times daily for 7 days.  Dispense: 14 tablet; Refill: 0 - albuterol (PROVENTIL HFA;VENTOLIN HFA) 108 (90 Base) MCG/ACT inhaler; Inhale 2 puffs into the lungs  every 6 (six) hours as needed for wheezing or shortness of breath.  Dispense: 1 Inhaler; Refill: 2  2. Pulmonary emphysema, unspecified emphysema type (HCC)  - DG Chest 2 View; Future  3. Pulmonary nodule   4. Cough with fever  - DG Chest 2 View; Future  5. Abnormal findings on diagnostic imaging of lung  - CT Chest Wo Contrast; Future  6. Nausea  - ondansetron (ZOFRAN) 4 MG tablet; Take 1 tablet (4 mg total) by mouth every 8 (eight) hours as needed for nausea or vomiting.  Dispense: 20 tablet; Refill: 0  Recommend rest, fluids, frequent hand washing. Work note provided  There are no Patient Instructions on file for this visit.   The entirety of the information documented in the History of Present Illness, Review of Systems and Physical Exam were personally obtained by me. Portions of this information were initially documented by Kavin Leech, CMA and reviewed by me for thoroughness and accuracy.

## 2017-07-23 ENCOUNTER — Telehealth: Payer: Self-pay

## 2017-07-23 NOTE — Telephone Encounter (Signed)
Pt advised.  He reports not feeling much better, but not worse.  He states he will be in tomorrow.   Thanks,   -Vernona RiegerLaura

## 2017-07-23 NOTE — Progress Notes (Signed)
Acknowledged.

## 2017-07-23 NOTE — Telephone Encounter (Signed)
-----   Message from Trey SailorsAdriana M Pollak, [place]PA-C sent at 07/23/2017  8:15 AM EDT ----- CXR shows some emphysema which is due to smoking. There are some changes compared to prior xray that might represent some fluid accumulation, infection, or chronic lung disease. I think how he looked in clinic more closely resembled an infection. How is he doing today? He has follow up tomorrow with Maurine Ministerennis, be sure he comes to this.

## 2017-07-24 ENCOUNTER — Encounter: Payer: Self-pay | Admitting: Family Medicine

## 2017-07-24 ENCOUNTER — Ambulatory Visit: Payer: BLUE CROSS/BLUE SHIELD | Admitting: Family Medicine

## 2017-07-24 VITALS — BP 124/60 | HR 93 | Temp 98.2°F | Wt 175.4 lb

## 2017-07-24 DIAGNOSIS — J209 Acute bronchitis, unspecified: Secondary | ICD-10-CM | POA: Diagnosis not present

## 2017-07-24 DIAGNOSIS — J440 Chronic obstructive pulmonary disease with acute lower respiratory infection: Secondary | ICD-10-CM

## 2017-07-24 DIAGNOSIS — Z720 Tobacco use: Secondary | ICD-10-CM

## 2017-07-24 NOTE — Progress Notes (Signed)
Patient: Jason Gutierrez Male    DOB: 06/02/1965   52 y.o.   MRN: [id number] Visit Date: 07/24/2017  Today's Provider: Dortha Kernennis Chrismon, PA   Chief Complaint  Patient presents with  . COPD    follow up    Subjective:    HPI COPD exacerbation: Patient presents for a 2 day follow up. Last OV was on 07/22/17. Patient seen Adriana. CXR shows some emphysema which is due to smoking. There are some changes compared to prior xray that might represent some fluid accumulation, infection, or chronic lung disease. Patient started Albuterol inhaler, Doxycycline 100 mg, and Zofran. He reports good compliance with treatment plan. Symptoms are gradually improving.     Past Medical History:  Diagnosis Date  . History of pneumonia 10/26/2014   Past Surgical History:  Procedure Laterality Date  . Lumbar spine x ray  08/13/2009   Mild DDD L3-L4  . MRI Lumbar spine  07/05/2007   small central HNP at C4-C5 effaces anterior cervical cord. Mild encroachment at neural forminal at C5-C6, C6-C7, and C7-T1 due to annular buldge  . REPLACEMENT TOTAL KNEE  1980   Family History  Problem Relation Age of Onset  . Osteoporosis Mother   . Heart attack Father   . Stroke Father   . Heart attack Brother 43  . Bone cancer Maternal Uncle   . CAD Brother   . CAD Brother   . CAD Brother    Allergies  Allergen Reactions  . Augmentin [Amoxicillin-Pot Clavulanate] Diarrhea and Nausea And Vomiting    Has no adverse reaction from amoxicillin  . Levitra [Vardenafil]   . Penicillins   . Vicodin [Hydrocodone-Acetaminophen]     Current Outpatient Medications:  .  albuterol (PROVENTIL HFA;VENTOLIN HFA) 108 (90 Base) MCG/ACT inhaler, Inhale 2 puffs into the lungs every 6 (six) hours as needed for wheezing or shortness of breath., Disp: 1 Inhaler, Rfl: 2 .  atorvastatin (LIPITOR) 40 MG tablet, Take 40 mg by mouth daily., Disp: , Rfl:  .  cyclobenzaprine (FLEXERIL) 5 MG tablet, Take by mouth., Disp: , Rfl:  .   doxycycline (VIBRA-TABS) 100 MG tablet, Take 1 tablet (100 mg total) by mouth 2 (two) times daily for 7 days., Disp: 14 tablet, Rfl: 0 .  HYDROcodone-acetaminophen (NORCO) 7.5-325 MG tablet, Take 1 tablet by mouth every 6 (six) hours as needed. For pain, Disp: , Rfl: 0 .  meloxicam (MOBIC) 15 MG tablet, Take 1 tablet by mouth daily., Disp: , Rfl: 5 .  ondansetron (ZOFRAN) 4 MG tablet, Take 1 tablet (4 mg total) by mouth every 8 (eight) hours as needed for nausea or vomiting., Disp: 20 tablet, Rfl: 0 .  sildenafil (VIAGRA) 100 MG tablet, TAKE 1 TABLET BY MOUTH AS NEEDED. MAX 1 PER DAY, Disp: 8 tablet, Rfl: 5 .  tadalafil (CIALIS) 20 MG tablet, Take 20 mg by mouth daily as needed for erectile dysfunction., Disp: , Rfl:  .  zolpidem (AMBIEN) 10 MG tablet, Take 10 mg by mouth at bedtime as needed for sleep. 0.5-1 tablet  at bed time, Disp: , Rfl:   Review of Systems  Constitutional: Positive for chills, diaphoresis and fatigue.  HENT: Positive for congestion, ear pain, sinus pressure, sinus pain and sore throat.   Respiratory: Positive for cough, chest tightness, shortness of breath and wheezing.   Neurological: Negative for dizziness, light-headedness and headaches.   Social History   Tobacco Use  . Smoking status: Current Every Day Smoker  Packs/day: 1.50  . Smokeless tobacco: Never Used  Substance Use Topics  . Alcohol use: Yes    Alcohol/week: 0.0 oz    Comment: 1 beer weekly   Objective:   BP 124/60 (BP Location: Right Arm, Patient Position: Sitting, Cuff Size: Normal)   Pulse 93   Temp 98.2 F (36.8 C) (Oral)   Wt 175 lb 6.4 oz (79.6 kg)   SpO2 93%   BMI 26.87 kg/m   Physical Exam  Constitutional: He is oriented to person, place, and time. He appears well-developed and well-nourished. No distress.  HENT:  Head: Normocephalic and atraumatic.  Right Ear: Hearing normal.  Left Ear: Hearing normal.  Nose: Nose normal.  Eyes: Conjunctivae and lids are normal. Right eye  exhibits no discharge. Left eye exhibits no discharge. No scleral icterus.  Cardiovascular: Normal rate and regular rhythm.  Pulmonary/Chest: Effort normal. No respiratory distress. He has no wheezes. He has no rales.  Slightly coarse breath sounds. No dyspnea.  Musculoskeletal: Normal range of motion.  Neurological: He is alert and oriented to person, place, and time.  Skin: Skin is intact. No lesion and no rash noted.  Psychiatric: He has a normal mood and affect. His speech is normal and behavior is normal. Thought content normal.      Assessment & Plan:     1. Acute bronchitis with COPD (HCC) Feeling better and no further fever since change to Doxycycline 2 days ago. Still using Mucinex and has Albuterol inhaler to use prn. Drink extra fluids and recheck if any worsening.  2. Tobacco abuse Still smoking 1.5 ppd. Counseled regarding smoking cessation. Family history positive for a couple brothers having COPD and lung cancer at their deaths. Wants to work on tapering down. Recommend keeping appointment on 08-26-17 with Dr. Sherrie Mustache and probably needs spirometry.       Dortha Kern, PA  [facility]

## 2017-07-28 DIAGNOSIS — M4726 Other spondylosis with radiculopathy, lumbar region: Secondary | ICD-10-CM | POA: Diagnosis not present

## 2017-07-28 DIAGNOSIS — G894 Chronic pain syndrome: Secondary | ICD-10-CM | POA: Diagnosis not present

## 2017-07-28 DIAGNOSIS — M545 Low back pain: Secondary | ICD-10-CM | POA: Diagnosis not present

## 2017-08-07 ENCOUNTER — Ambulatory Visit
Admission: RE | Admit: 2017-08-07 | Discharge: 2017-08-07 | Disposition: A | Discharge status: Home / Self Care | Payer: BLUE CROSS/BLUE SHIELD | Source: Ambulatory Visit | Attending: Physician Assistant | Admitting: Physician Assistant

## 2017-08-07 DIAGNOSIS — J438 Other emphysema: Secondary | ICD-10-CM | POA: Insufficient documentation

## 2017-08-07 DIAGNOSIS — R918 Other nonspecific abnormal finding of lung field: Secondary | ICD-10-CM | POA: Diagnosis not present

## 2017-08-07 DIAGNOSIS — J432 Centrilobular emphysema: Secondary | ICD-10-CM | POA: Insufficient documentation

## 2017-08-07 DIAGNOSIS — J439 Emphysema, unspecified: Secondary | ICD-10-CM | POA: Diagnosis not present

## 2017-08-10 ENCOUNTER — Telehealth: Payer: Self-pay

## 2017-08-10 NOTE — Telephone Encounter (Signed)
Pt advised.   Thanks,   -Debara Kamphuis  

## 2017-08-10 NOTE — Telephone Encounter (Signed)
-----   Message from Trey SailorsAdriana M Pollak, [place]PA-C sent at 08/10/2017  1:49 PM EDT ----- Follow up Chest CT for pulmonary nodules showed stable, likely benign growths and also changes due to smoking. Forwarding this to Dr. Sherrie MustacheFisher, his PCP.

## 2017-08-25 NOTE — Progress Notes (Signed)
Patient: Jason Gutierrez, Male    DOB: 1965-10-18, 52 y.o.   MRN: [id number] Visit Date: 08/26/2017  Today's Provider: Lelon Huh, MD   Chief Complaint  Patient presents with  . Annual Exam   Subjective:    Annual physical exam Jason Gutierrez is a 52 y.o. male who presents today for health maintenance and complete physical. He feels well. He reports exercising none. He reports he is sleeping poorly due to back pain.   -----------------------------------------------------------------   Lipid/Cholesterol, Follow-up:   Last seen for this over 1 year ago. Management since that visit includes; current treatment Atorvastatin 40 mg qd.  Last Lipid Panel:    Component Value Date/Time   CHOL 166 09/29/2011   TRIG 246 (A) 09/29/2011   HDL 30 (A) 09/29/2011   LDLCALC 87 09/29/2011    He reports poor compliance with treatment. Patient states he stopped taking medication. He is not having side effects.   Wt Readings from Last 3 Encounters:  08/26/17 175 lb 6.4 oz (79.6 kg)  07/24/17 175 lb 6.4 oz (79.6 kg)  07/22/17 172 lb (78 kg)    ------------------------------------------------------------------------  COPD exacerbation ([place]) From 07/24/2017-seen by Vernie Murders. Patient was advised to continue inhalers prn which he is tolerating well.    Insomnia Current treatment-Ambien. Patient states he has not been taking medication.   Review of Systems  Constitutional: Negative.   HENT: Positive for congestion.   Eyes: Negative.   Respiratory: Positive for cough. Negative for shortness of breath.   Cardiovascular: Negative.  Negative for chest pain and palpitations.  Gastrointestinal: Negative.   Endocrine: Negative.   Genitourinary: Negative.   Musculoskeletal: Positive for arthralgias, back pain, myalgias and neck pain.  Skin: Negative.   Allergic/Immunologic: Negative.   Neurological: Negative.   Hematological: Negative.   Psychiatric/Behavioral:  Positive for sleep disturbance.    Social History      He  reports that he has been smoking.  He has been smoking about 1.00 pack per day. He has never used smokeless tobacco. He reports that he drinks alcohol. He reports that he does not use drugs.       Social History   Socioeconomic History  . Marital status: Married    Spouse name: Not on file  . Number of children: 3  . Years of education: Not on file  . Highest education level: Not on file  Occupational History  . Occupation: Tax adviser    Comment: Works at  [employer]  . Financial resource strain: Not on file  . Food insecurity:    Worry: Not on file    Inability: Not on file  . Transportation needs:    Medical: Not on file    Non-medical: Not on file  Tobacco Use  . Smoking status: Current Every Day Smoker    Packs/day: 1.00  . Smokeless tobacco: Never Used  Substance and Sexual Activity  . Alcohol use: Yes    Alcohol/week: 0.0 oz    Comment: 1 beer weekly  . Drug use: No  . Sexual activity: Not on file  Lifestyle  . Physical activity:    Days per week: Not on file    Minutes per session: Not on file  . Stress: Not on file  Relationships  . Social connections:    Talks on phone: Not on file    Gets together: Not on file    Attends religious service: Not on file    Active  member of club or organization: Not on file    Attends meetings of clubs or organizations: Not on file    Relationship status: Not on file  Other Topics Concern  . Not on file  Social History Narrative  . Not on file    Past Medical History:  Diagnosis Date  . History of pneumonia 10/26/2014     Patient Active Problem List   Diagnosis Date Noted  . COPD (chronic obstructive pulmonary disease) ([place]) 07/22/2017  . Family history of early CAD 10/27/2014  . Rectal cyst 10/26/2014  . 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 Mixed hyperlipidemia 10/26/2014  . Tobacco abuse 10/26/2014  . Insomnia 10/26/2014  . Low back pain  10/26/2014  . Erectile dysfunction 10/26/2014  . Lung nodule 10/26/2014  . Herniated cervical disc 07/05/2007    Past Surgical History:  Procedure Laterality Date  . Lumbar spine x ray  08/13/2009   Mild DDD L3-L4  . MRI Lumbar spine  07/05/2007   small central HNP at C4-C5 effaces anterior cervical cord. Mild encroachment at neural forminal at C5-C6, C6-C7, and C7-T1 due to annular buldge  . REPLACEMENT TOTAL KNEE  1980    Family History        Family Status  Relation Name Status  . Mother  Deceased  . Father  Deceased  . Brother ##Brother1 Deceased at age 61  . Mat Uncle ##Mat Uncle1 Deceased  . Brother ##Brother3 Alive  . Brother ##Brother4 Alive  . Brother ##Brother5 Alive        His family history includes Bone cancer in his maternal uncle; CAD in his brother, brother, and brother; Heart attack in his father; Heart attack (age of onset: 73) in his brother; Osteoporosis in his mother; Stroke in his father.      Allergies  Allergen Reactions  . Augmentin [Amoxicillin-Pot Clavulanate] Diarrhea and Nausea And Vomiting    Has no adverse reaction from amoxicillin  . Levitra [Vardenafil]   . Penicillins   . Vicodin [Hydrocodone-Acetaminophen]      Current Outpatient Medications:  .  albuterol (PROVENTIL HFA;VENTOLIN HFA) 108 (90 Base) MCG/ACT inhaler, Inhale 2 puffs into the lungs every 6 (six) hours as needed for wheezing or shortness of breath., Disp: 1 Inhaler, Rfl: 2 .  cyclobenzaprine (FLEXERIL) 10 MG tablet, TK 1 T PO QHS, Disp: , Rfl: 2 .  HYDROcodone-acetaminophen (NORCO) 7.5-325 MG tablet, Take 1 tablet by mouth every 6 (six) hours as needed. For pain, Disp: , Rfl: 0 .  meloxicam (MOBIC) 15 MG tablet, Take 1 tablet by mouth daily., Disp: , Rfl: 5 .  ondansetron (ZOFRAN) 4 MG tablet, Take 1 tablet (4 mg total) by mouth every 8 (eight) hours as needed for nausea or vomiting., Disp: 20 tablet, Rfl: 0 .  sildenafil (VIAGRA) 100 MG tablet, TAKE 1 TABLET BY MOUTH AS  NEEDED. MAX 1 PER DAY, Disp: 8 tablet, Rfl: 5 .  atorvastatin (LIPITOR) 40 MG tablet, Take 40 mg by mouth daily., Disp: , Rfl:  .  zolpidem (AMBIEN) 10 MG tablet, Take 10 mg by mouth at bedtime as needed for sleep. 0.5-1 tablet  at bed time, Disp: , Rfl:    Patient Care Team: Birdie Sons, MD as PCP - General (Family Medicine) Lucky Cowboy Erskine Squibb, MD as Referring Physician (Vascular Surgery)      Objective:   Vitals: BP 124/68 (BP Location: Left Arm, Patient Position: Sitting, Cuff Size: Normal)   Pulse 97   Temp 98.2 F (36.8 C) (  Oral)   Resp 16   Ht '5\' 8"'  (1.727 m)   Wt 175 lb 6.4 oz (79.6 kg)   SpO2 95%   BMI 26.67 kg/m    Vitals:   08/26/17 1356  BP: 124/68  Pulse: 97  Resp: 16  Temp: 98.2 F (36.8 C)  TempSrc: Oral  SpO2: 95%  Weight: 175 lb 6.4 oz (79.6 kg)  Height: '5\' 8"'  (1.727 m)     Physical Exam   General Appearance:    Alert, cooperative, no distress, appears stated age  Head:    Normocephalic, without obvious abnormality, atraumatic  Eyes:    PERRL, conjunctiva/corneas clear, EOM's intact, fundi    benign, both eyes       Ears:    Normal TM's and external ear canals, both ears  Nose:   Nares normal, septum midline, mucosa normal, no drainage   or sinus tenderness  Throat:   Lips, mucosa, and tongue normal; teeth and gums normal  Neck:   Supple, symmetrical, trachea midline, no adenopathy;       thyroid:  No enlargement/tenderness/nodules; no carotid   bruit or JVD  Back:     Symmetric, no curvature, ROM normal, no CVA tenderness  Lungs:     Clear to auscultation bilaterally, respirations unlabored  Chest wall:    No tenderness or deformity  Heart:    Regular rate and rhythm, S1 and S2 normal, no murmur, rub   or gallop  Abdomen:     Soft, non-tender, bowel sounds active all four quadrants,    no masses, no organomegaly  Genitalia:    deferred  Rectal:    deferred  Extremities:   Extremities normal, atraumatic, no cyanosis or edema  Pulses:   2+ and  symmetric all extremities  Skin:   Skin color, texture, turgor normal, no rashes or lesions  Lymph nodes:   Cervical, supraclavicular, and axillary nodes normal  Neurologic:   CNII-XII intact. Normal strength, sensation and reflexes      throughout     Depression Screen PHQ 2/9 Scores 08/26/2017 02/16/2017  PHQ - 2 Score 0 0  PHQ- 9 Score 1 -      Assessment & Plan:     Routine Health Maintenance and Physical Exam  Exercise Activities and Dietary recommendations Goals    None      Immunization History  Administered Date(s) Administered  . Influenza-Unspecified 11/30/2014  . Tdap 05/28/2006    Health Maintenance  Topic Date Due  . Hepatitis C Screening  April 22, 1965  . HIV Screening  08/27/1978  . COLONOSCOPY  08/26/2013  . TETANUS/TDAP  05/27/2016  . INFLUENZA VACCINE  10/19/2017 (Originally 09/03/2017)     Discussed health benefits of physical activity, and encouraged him to engage in regular exercise appropriate for his age and condition.    --------------------------------------------------------------------  1. Annual physical exam  - HIV antibody - EKG 12-Lead  2. Colon cancer screening Counseled he is due for colonoscopy which he declined at this time. Was given iFOBT collection kit.   3. Need for prophylactic vaccination using tetanus and diphtheria toxoids adsorbed (Td) vaccine  - Td : Tetanus/diphtheria >7yo Preservative  free  4. Pulmonary emphysema, unspecified emphysema type (HCC) Stable, Continue current medications.    5. Mixed hyperlipidemia He is tolerating atorvastatin well with no adverse effects.   - Lipid Profile - Comprehensive Metabolic Panel (CMET) - EKG 12-Lead  6. Tobacco abuse Counseled health benefits of smoking cessation. He states he tried bupropion and  Chantix which were ineffective.   7. Need for hepatitis C screening test  - Hepatitis C Antibody  8. Prostate cancer screening  - PSA   Lelon Huh, MD    [facility]

## 2017-08-26 ENCOUNTER — Ambulatory Visit (INDEPENDENT_AMBULATORY_CARE_PROVIDER_SITE_OTHER): Payer: BLUE CROSS/BLUE SHIELD | Admitting: Family Medicine

## 2017-08-26 ENCOUNTER — Encounter: Payer: Self-pay | Admitting: Family Medicine

## 2017-08-26 VITALS — BP 124/68 | HR 97 | Temp 98.2°F | Resp 16 | Ht 68.0 in | Wt 175.4 lb

## 2017-08-26 DIAGNOSIS — Z720 Tobacco use: Secondary | ICD-10-CM

## 2017-08-26 DIAGNOSIS — E782 Mixed hyperlipidemia: Secondary | ICD-10-CM

## 2017-08-26 DIAGNOSIS — Z125 Encounter for screening for malignant neoplasm of prostate: Secondary | ICD-10-CM

## 2017-08-26 DIAGNOSIS — J439 Emphysema, unspecified: Secondary | ICD-10-CM

## 2017-08-26 DIAGNOSIS — Z1211 Encounter for screening for malignant neoplasm of colon: Secondary | ICD-10-CM

## 2017-08-26 DIAGNOSIS — Z1159 Encounter for screening for other viral diseases: Secondary | ICD-10-CM

## 2017-08-26 DIAGNOSIS — Z0000 Encounter for general adult medical examination without abnormal findings: Secondary | ICD-10-CM

## 2017-08-26 DIAGNOSIS — Z23 Encounter for immunization: Secondary | ICD-10-CM | POA: Diagnosis not present

## 2017-08-31 DIAGNOSIS — M545 Low back pain: Secondary | ICD-10-CM | POA: Diagnosis not present

## 2017-08-31 DIAGNOSIS — G894 Chronic pain syndrome: Secondary | ICD-10-CM | POA: Diagnosis not present

## 2017-08-31 DIAGNOSIS — M4726 Other spondylosis with radiculopathy, lumbar region: Secondary | ICD-10-CM | POA: Diagnosis not present

## 2017-09-30 DIAGNOSIS — Z6824 Body mass index (BMI) 24.0-24.9, adult: Secondary | ICD-10-CM | POA: Diagnosis not present

## 2017-09-30 DIAGNOSIS — M4726 Other spondylosis with radiculopathy, lumbar region: Secondary | ICD-10-CM | POA: Diagnosis not present

## 2017-09-30 DIAGNOSIS — G894 Chronic pain syndrome: Secondary | ICD-10-CM | POA: Diagnosis not present

## 2017-09-30 DIAGNOSIS — M545 Low back pain: Secondary | ICD-10-CM | POA: Diagnosis not present

## 2017-09-30 DIAGNOSIS — Z79899 Other long term (current) drug therapy: Secondary | ICD-10-CM | POA: Diagnosis not present

## 2017-10-29 DIAGNOSIS — Z6824 Body mass index (BMI) 24.0-24.9, adult: Secondary | ICD-10-CM | POA: Diagnosis not present

## 2017-10-29 DIAGNOSIS — M4726 Other spondylosis with radiculopathy, lumbar region: Secondary | ICD-10-CM | POA: Diagnosis not present

## 2017-10-29 DIAGNOSIS — G894 Chronic pain syndrome: Secondary | ICD-10-CM | POA: Diagnosis not present

## 2017-10-29 DIAGNOSIS — M545 Low back pain: Secondary | ICD-10-CM | POA: Diagnosis not present

## 2017-11-27 DIAGNOSIS — G894 Chronic pain syndrome: Secondary | ICD-10-CM | POA: Diagnosis not present

## 2017-11-27 DIAGNOSIS — M5030 Other cervical disc degeneration, unspecified cervical region: Secondary | ICD-10-CM | POA: Diagnosis not present

## 2017-11-27 DIAGNOSIS — M545 Low back pain: Secondary | ICD-10-CM | POA: Diagnosis not present

## 2017-11-27 DIAGNOSIS — M546 Pain in thoracic spine: Secondary | ICD-10-CM | POA: Diagnosis not present

## 2017-11-27 DIAGNOSIS — M4726 Other spondylosis with radiculopathy, lumbar region: Secondary | ICD-10-CM | POA: Diagnosis not present

## 2017-11-27 DIAGNOSIS — M791 Myalgia, unspecified site: Secondary | ICD-10-CM | POA: Diagnosis not present

## 2017-12-25 DIAGNOSIS — M545 Low back pain: Secondary | ICD-10-CM | POA: Diagnosis not present

## 2017-12-25 DIAGNOSIS — G894 Chronic pain syndrome: Secondary | ICD-10-CM | POA: Diagnosis not present

## 2017-12-25 DIAGNOSIS — M4726 Other spondylosis with radiculopathy, lumbar region: Secondary | ICD-10-CM | POA: Diagnosis not present

## 2017-12-25 DIAGNOSIS — Z6824 Body mass index (BMI) 24.0-24.9, adult: Secondary | ICD-10-CM | POA: Diagnosis not present

## 2018-01-22 DIAGNOSIS — M545 Low back pain: Secondary | ICD-10-CM | POA: Diagnosis not present

## 2018-01-22 DIAGNOSIS — M7918 Myalgia, other site: Secondary | ICD-10-CM | POA: Diagnosis not present

## 2018-01-22 DIAGNOSIS — G894 Chronic pain syndrome: Secondary | ICD-10-CM | POA: Diagnosis not present

## 2018-01-22 DIAGNOSIS — M4726 Other spondylosis with radiculopathy, lumbar region: Secondary | ICD-10-CM | POA: Diagnosis not present

## 2018-02-19 DIAGNOSIS — M791 Myalgia, unspecified site: Secondary | ICD-10-CM | POA: Diagnosis not present

## 2018-02-19 DIAGNOSIS — G894 Chronic pain syndrome: Secondary | ICD-10-CM | POA: Diagnosis not present

## 2018-02-19 DIAGNOSIS — M546 Pain in thoracic spine: Secondary | ICD-10-CM | POA: Diagnosis not present

## 2018-02-26 ENCOUNTER — Other Ambulatory Visit: Payer: Self-pay | Admitting: Family Medicine

## 2018-04-09 DIAGNOSIS — G894 Chronic pain syndrome: Secondary | ICD-10-CM | POA: Diagnosis not present

## 2018-04-09 DIAGNOSIS — M791 Myalgia, unspecified site: Secondary | ICD-10-CM | POA: Diagnosis not present

## 2018-04-09 DIAGNOSIS — Z79899 Other long term (current) drug therapy: Secondary | ICD-10-CM | POA: Diagnosis not present

## 2018-04-09 DIAGNOSIS — M546 Pain in thoracic spine: Secondary | ICD-10-CM | POA: Diagnosis not present

## 2018-04-30 DIAGNOSIS — M546 Pain in thoracic spine: Secondary | ICD-10-CM | POA: Diagnosis not present

## 2018-04-30 DIAGNOSIS — M791 Myalgia, unspecified site: Secondary | ICD-10-CM | POA: Diagnosis not present

## 2018-04-30 DIAGNOSIS — G894 Chronic pain syndrome: Secondary | ICD-10-CM | POA: Diagnosis not present

## 2018-06-11 DIAGNOSIS — M791 Myalgia, unspecified site: Secondary | ICD-10-CM | POA: Diagnosis not present

## 2018-06-11 DIAGNOSIS — M546 Pain in thoracic spine: Secondary | ICD-10-CM | POA: Diagnosis not present

## 2018-06-11 DIAGNOSIS — G894 Chronic pain syndrome: Secondary | ICD-10-CM | POA: Diagnosis not present

## 2018-07-16 DIAGNOSIS — M7918 Myalgia, other site: Secondary | ICD-10-CM | POA: Diagnosis not present

## 2018-07-16 DIAGNOSIS — G894 Chronic pain syndrome: Secondary | ICD-10-CM | POA: Diagnosis not present

## 2018-07-16 DIAGNOSIS — M546 Pain in thoracic spine: Secondary | ICD-10-CM | POA: Diagnosis not present

## 2018-08-26 DIAGNOSIS — M545 Low back pain: Secondary | ICD-10-CM | POA: Diagnosis not present

## 2018-08-26 DIAGNOSIS — G894 Chronic pain syndrome: Secondary | ICD-10-CM | POA: Diagnosis not present

## 2018-08-26 DIAGNOSIS — M791 Myalgia, unspecified site: Secondary | ICD-10-CM | POA: Diagnosis not present

## 2018-09-28 DIAGNOSIS — M791 Myalgia, unspecified site: Secondary | ICD-10-CM | POA: Diagnosis not present

## 2018-09-28 DIAGNOSIS — Z79899 Other long term (current) drug therapy: Secondary | ICD-10-CM | POA: Diagnosis not present

## 2018-09-28 DIAGNOSIS — Z6826 Body mass index (BMI) 26.0-26.9, adult: Secondary | ICD-10-CM | POA: Diagnosis not present

## 2018-09-28 DIAGNOSIS — G894 Chronic pain syndrome: Secondary | ICD-10-CM | POA: Diagnosis not present

## 2018-09-28 DIAGNOSIS — M545 Low back pain: Secondary | ICD-10-CM | POA: Diagnosis not present

## 2018-11-29 DIAGNOSIS — M545 Low back pain: Secondary | ICD-10-CM | POA: Diagnosis not present

## 2018-11-29 DIAGNOSIS — M791 Myalgia, unspecified site: Secondary | ICD-10-CM | POA: Diagnosis not present

## 2018-11-29 DIAGNOSIS — G894 Chronic pain syndrome: Secondary | ICD-10-CM | POA: Diagnosis not present

## 2019-01-04 DIAGNOSIS — G894 Chronic pain syndrome: Secondary | ICD-10-CM | POA: Diagnosis not present

## 2019-01-04 DIAGNOSIS — M791 Myalgia, unspecified site: Secondary | ICD-10-CM | POA: Diagnosis not present

## 2019-01-04 DIAGNOSIS — M546 Pain in thoracic spine: Secondary | ICD-10-CM | POA: Diagnosis not present

## 2019-02-16 ENCOUNTER — Telehealth: Payer: Self-pay

## 2019-02-16 ENCOUNTER — Ambulatory Visit: Payer: Self-pay | Attending: Internal Medicine

## 2019-02-16 DIAGNOSIS — U071 COVID-19: Secondary | ICD-10-CM | POA: Insufficient documentation

## 2019-02-16 DIAGNOSIS — Z20822 Contact with and (suspected) exposure to covid-19: Secondary | ICD-10-CM

## 2019-02-16 HISTORY — DX: COVID-19: U07.1

## 2019-02-16 NOTE — Telephone Encounter (Signed)
Copied from CRM [id number]. Topic: General - Other >> Feb 16, 2019  1:55 PM Jaquita Rector A wrote: Reason for CRM: Patient called to inform Dr Sherrie Mustache that he think he may have covid since his girlfriend was recently diagnosed. Had a fever, has body aches and pain, fatigue cough and congestion. Asking for Rx to help him feel better. Ph#  [phone]

## 2019-02-16 NOTE — Telephone Encounter (Signed)
If he would like a prescription sent, he needs to be evaluated with a virtual visit.

## 2019-02-16 NOTE — Telephone Encounter (Signed)
Patient was advised and scheduled appointment for tomorrow 02/17/2019 @ 9:00AM w/ Antony Contras.

## 2019-02-17 ENCOUNTER — Encounter: Payer: Self-pay | Admitting: Physician Assistant

## 2019-02-17 ENCOUNTER — Ambulatory Visit (INDEPENDENT_AMBULATORY_CARE_PROVIDER_SITE_OTHER): Payer: Self-pay | Admitting: Physician Assistant

## 2019-02-17 DIAGNOSIS — Z20822 Contact with and (suspected) exposure to covid-19: Secondary | ICD-10-CM

## 2019-02-17 LAB — NOVEL CORONAVIRUS, NAA: SARS-CoV-2, NAA: DETECTED — AB

## 2019-02-17 MED ORDER — BENZONATATE 200 MG PO CAPS: 200.0000 mg | ORAL_CAPSULE | Freq: Three times a day (TID) | ORAL | 0 refills | Status: DC | PRN

## 2019-02-17 MED ORDER — ALBUTEROL SULFATE HFA 108 (90 BASE) MCG/ACT IN AERS: 2.0000 | INHALATION_SPRAY | Freq: Four times a day (QID) | RESPIRATORY_TRACT | 1 refills | Status: DC | PRN

## 2019-02-17 NOTE — Patient Instructions (Signed)
Can take to lessen severity: Vit C 500mg twice daily Quercertin 250-500mg twice daily Zinc 75-100mg daily Melatonin 3-6 mg at bedtime Vit D3 1000-2000 IU daily Aspirin 81 mg daily with food Optional: Famotidine 20mg daily Also can add tylenol/ibuprofen as needed for fevers and body aches May add Mucinex or Mucinex DM as needed for cough/congestion  COVID-19 COVID-19 is a respiratory infection that is caused by a virus called severe acute respiratory syndrome coronavirus 2 (SARS-CoV-2). The disease is also known as coronavirus disease or novel coronavirus. In some people, the virus may not cause any symptoms. In others, it may cause a serious infection. The infection can get worse quickly and can lead to complications, such as:  Pneumonia, or infection of the lungs.  Acute respiratory distress syndrome or ARDS. This is a condition in which fluid build-up in the lungs prevents the lungs from filling with air and passing oxygen into the blood.  Acute respiratory failure. This is a condition in which there is not enough oxygen passing from the lungs to the body or when carbon dioxide is not passing from the lungs out of the body.  Sepsis or septic shock. This is a serious bodily reaction to an infection.  Blood clotting problems.  Secondary infections due to bacteria or fungus.  Organ failure. This is when your body's organs stop working. The virus that causes COVID-19 is contagious. This means that it can spread from person to person through droplets from coughs and sneezes (respiratory secretions). What are the causes? This illness is caused by a virus. You may catch the virus by:  Breathing in droplets from an infected person. Droplets can be spread by a person breathing, speaking, singing, coughing, or sneezing.  Touching something, like a table or a doorknob, that was exposed to the virus (contaminated) and then touching your mouth, nose, or eyes. What increases the risk? Risk for  infection You are more likely to be infected with this virus if you:  Are within 6 feet (2 meters) of a person with COVID-19.  Provide care for or live with a person who is infected with COVID-19.  Spend time in crowded indoor spaces or live in shared housing. Risk for serious illness You are more likely to become seriously ill from the virus if you:  Are 50 years of age or older. The higher your age, the more you are at risk for serious illness.  Live in a nursing home or long-term care facility.  Have cancer.  Have a long-term (chronic) disease such as: ? Chronic lung disease, including chronic obstructive pulmonary disease or asthma. ? A long-term disease that lowers your body's ability to fight infection (immunocompromised). ? Heart disease, including heart failure, a condition in which the arteries that lead to the heart become narrow or blocked (coronary artery disease), a disease which makes the heart muscle thick, weak, or stiff (cardiomyopathy). ? Diabetes. ? Chronic kidney disease. ? Sickle cell disease, a condition in which red blood cells have an abnormal "sickle" shape. ? Liver disease.  Are obese. What are the signs or symptoms? Symptoms of this condition can range from mild to severe. Symptoms may appear any time from 2 to 14 days after being exposed to the virus. They include:  A fever or chills.  A cough.  Difficulty breathing.  Headaches, body aches, or muscle aches.  Runny or stuffy (congested) nose.  A sore throat.  New loss of taste or smell. Some people may also have stomach problems,   such as nausea, vomiting, or diarrhea. Other people may not have any symptoms of COVID-19. How is this diagnosed? This condition may be diagnosed based on:  Your signs and symptoms, especially if: ? You live in an area with a COVID-19 outbreak. ? You recently traveled to or from an area where the virus is common. ? You provide care for or live with a person who  was diagnosed with COVID-19. ? You were exposed to a person who was diagnosed with COVID-19.  A physical exam.  Lab tests, which may include: ? Taking a sample of fluid from the back of your nose and throat (nasopharyngeal fluid), your nose, or your throat using a swab. ? A sample of mucus from your lungs (sputum). ? Blood tests.  Imaging tests, which may include, X-rays, CT scan, or ultrasound. How is this treated? At present, there is no medicine to treat COVID-19. Medicines that treat other diseases are being used on a trial basis to see if they are effective against COVID-19. Your health care provider will talk with you about ways to treat your symptoms. For most people, the infection is mild and can be managed at home with rest, fluids, and over-the-counter medicines. Treatment for a serious infection usually takes places in a hospital intensive care unit (ICU). It may include one or more of the following treatments. These treatments are given until your symptoms improve.  Receiving fluids and medicines through an IV.  Supplemental oxygen. Extra oxygen is given through a tube in the nose, a face mask, or a hood.  Positioning you to lie on your stomach (prone position). This makes it easier for oxygen to get into the lungs.  Continuous positive airway pressure (CPAP) or bi-level positive airway pressure (BPAP) machine. This treatment uses mild air pressure to keep the airways open. A tube that is connected to a motor delivers oxygen to the body.  Ventilator. This treatment moves air into and out of the lungs by using a tube that is placed in your windpipe.  Tracheostomy. This is a procedure to create a hole in the neck so that a breathing tube can be inserted.  Extracorporeal membrane oxygenation (ECMO). This procedure gives the lungs a chance to recover by taking over the functions of the heart and lungs. It supplies oxygen to the body and removes carbon dioxide. Follow these  instructions at home: Lifestyle  If you are sick, stay home except to get medical care. Your health care provider will tell you how long to stay home. Call your health care provider before you go for medical care.  Rest at home as told by your health care provider.  Do not use any products that contain nicotine or tobacco, such as cigarettes, e-cigarettes, and chewing tobacco. If you need help quitting, ask your health care provider.  Return to your normal activities as told by your health care provider. Ask your health care provider what activities are safe for you. General instructions  Take over-the-counter and prescription medicines only as told by your health care provider.  Drink enough fluid to keep your urine pale yellow.  Keep all follow-up visits as told by your health care provider. This is important. How is this prevented?  There is no vaccine to help prevent COVID-19 infection. However, there are steps you can take to protect yourself and others from this virus. To protect yourself:   Do not travel to areas where COVID-19 is a risk. The areas where COVID-19 is reported change   often. To identify high-risk areas and travel restrictions, check the CDC travel website: wwwnc.cdc.gov/travel/notices  If you live in, or must travel to, an area where COVID-19 is a risk, take precautions to avoid infection. ? Stay away from people who are sick. ? Wash your hands often with soap and water for 20 seconds. If soap and water are not available, use an alcohol-based hand sanitizer. ? Avoid touching your mouth, face, eyes, or nose. ? Avoid going out in public, follow guidance from your state and local health authorities. ? If you must go out in public, wear a cloth face covering or face mask. Make sure your mask covers your nose and mouth. ? Avoid crowded indoor spaces. Stay at least 6 feet (2 meters) away from others. ? Disinfect objects and surfaces that are frequently touched every day.  This may include:  Counters and tables.  Doorknobs and light switches.  Sinks and faucets.  Electronics, such as phones, remote controls, keyboards, computers, and tablets. To protect others: If you have symptoms of COVID-19, take steps to prevent the virus from spreading to others.  If you think you have a COVID-19 infection, contact your health care provider right away. Tell your health care team that you think you may have a COVID-19 infection.  Stay home. Leave your house only to seek medical care. Do not use public transport.  Do not travel while you are sick.  Wash your hands often with soap and water for 20 seconds. If soap and water are not available, use alcohol-based hand sanitizer.  Stay away from other members of your household. Let healthy household members care for children and pets, if possible. If you have to care for children or pets, wash your hands often and wear a mask. If possible, stay in your own room, separate from others. Use a different bathroom.  Make sure that all people in your household wash their hands well and often.  Cough or sneeze into a tissue or your sleeve or elbow. Do not cough or sneeze into your hand or into the air.  Wear a cloth face covering or face mask. Make sure your mask covers your nose and mouth. Where to find more information  Centers for Disease Control and Prevention: www.cdc.gov/coronavirus/2019-ncov/index.html  World Health Organization: www.who.int/health-topics/coronavirus Contact a health care provider if:  You live in or have traveled to an area where COVID-19 is a risk and you have symptoms of the infection.  You have had contact with someone who has COVID-19 and you have symptoms of the infection. Get help right away if:  You have trouble breathing.  You have pain or pressure in your chest.  You have confusion.  You have bluish lips and fingernails.  You have difficulty waking from sleep.  You have symptoms  that get worse. These symptoms may represent a serious problem that is an emergency. Do not wait to see if the symptoms will go away. Get medical help right away. Call your local emergency services (911 in the [place] Do not drive yourself to the hospital. Let the emergency medical personnel know if you think you have COVID-19. Summary  COVID-19 is a respiratory infection that is caused by a virus. It is also known as coronavirus disease or novel coronavirus. It can cause serious infections, such as pneumonia, acute respiratory distress syndrome, acute respiratory failure, or sepsis.  The virus that causes COVID-19 is contagious. This means that it can spread from person to person through droplets from breathing, speaking,   singing, coughing, or sneezing.  You are more likely to develop a serious illness if you are 50 years of age or older, have a weak immune system, live in a nursing home, or have chronic disease.  There is no medicine to treat COVID-19. Your health care provider will talk with you about ways to treat your symptoms.  Take steps to protect yourself and others from infection. Wash your hands often and disinfect objects and surfaces that are frequently touched every day. Stay away from people who are sick and wear a mask if you are sick. This information is not intended to replace advice given to you by your health care provider. Make sure you discuss any questions you have with your health care provider. Document Revised: 11/19/2018 Document Reviewed: 02/25/2018 Elsevier Patient Education  2020 [employer] Inc.  

## 2019-02-17 NOTE — Progress Notes (Signed)
Patient: Jason Gutierrez Male    DOB: 07-01-1965   54 y.o.   MRN: [id number] Visit Date: 02/17/2019  Today's Provider: Margaretann Loveless, PA-C   Chief Complaint  Patient presents with  . URI   Subjective:      Virtual Visit via Telephone Note  I connected with Jason Gutierrez on 02/17/19 at  9:00 AM EST by telephone and verified that I am speaking with the correct person using two identifiers.  Location: Patient: Home Provider: [facility]   I discussed the limitations, risks, security and privacy concerns of performing an evaluation and management service by telephone and the availability of in person appointments. I also discussed with the patient that there may be a patient responsible charge related to this service. The patient expressed understanding and agreed to proceed.    URI  This is a new problem. The current episode started in the past 7 days. The problem has been gradually worsening. The maximum temperature recorded prior to his arrival was 101 - 101.9 F. Associated symptoms include coughing, diarrhea, rhinorrhea and wheezing. Pertinent negatives include no abdominal pain, ear pain, headaches, nausea, sinus pain, sore throat or vomiting. He has tried acetaminophen for the symptoms. The treatment provided mild relief.  Girlfriend has recently had covid. He has lost sense of smell and taste. Covid testing was done yesterday.    Allergies  Allergen Reactions  . Augmentin [Amoxicillin-Pot Clavulanate] Diarrhea and Nausea And Vomiting    Has no adverse reaction from amoxicillin  . Levitra [Vardenafil]   . Penicillins   . Vicodin [Hydrocodone-Acetaminophen]      Current Outpatient Medications:  .  cyclobenzaprine (FLEXERIL) 10 MG tablet, TK 1 T PO QHS, Disp: , Rfl: 2 .  HYDROcodone-acetaminophen (NORCO) 7.5-325 MG tablet, Take 1 tablet by mouth every 6 (six) hours as needed. For pain, Disp: , Rfl: 0 .  meloxicam (MOBIC) 15 MG tablet, Take 1 tablet by mouth  daily., Disp: , Rfl: 5 .  ondansetron (ZOFRAN) 4 MG tablet, Take 1 tablet (4 mg total) by mouth every 8 (eight) hours as needed for nausea or vomiting., Disp: 20 tablet, Rfl: 0 .  albuterol (PROVENTIL HFA;VENTOLIN HFA) 108 (90 Base) MCG/ACT inhaler, Inhale 2 puffs into the lungs every 6 (six) hours as needed for wheezing or shortness of breath. (Patient not taking: Reported on 02/17/2019), Disp: 1 Inhaler, Rfl: 2 .  atorvastatin (LIPITOR) 40 MG tablet, Take 40 mg by mouth daily., Disp: , Rfl:  .  sildenafil (VIAGRA) 100 MG tablet, TAKE 1 TABLET BY MOUTH AS NEEDED. MAX 1 TABLET DAILY (Patient not taking: Reported on 02/17/2019), Disp: 8 tablet, Rfl: 5 .  zolpidem (AMBIEN) 10 MG tablet, Take 10 mg by mouth at bedtime as needed for sleep. 0.5-1 tablet  at bed time, Disp: , Rfl:   Review of Systems  Constitutional: Positive for chills, fatigue and fever. Negative for activity change, appetite change, diaphoresis and unexpected weight change.  HENT: Positive for postnasal drip and rhinorrhea. Negative for ear discharge, ear pain, sinus pressure, sinus pain, sore throat, tinnitus and trouble swallowing.   Eyes: Positive for itching. Negative for photophobia, pain, discharge, redness and visual disturbance.  Respiratory: Positive for cough, chest tightness, shortness of breath and wheezing. Negative for apnea, choking and stridor.   Gastrointestinal: Positive for diarrhea. Negative for abdominal distention, abdominal pain, anal bleeding, blood in stool, constipation, nausea, rectal pain and vomiting.  Musculoskeletal: Positive for myalgias.  Neurological: Positive for  dizziness. Negative for light-headedness and headaches.    Social History   Tobacco Use  . Smoking status: Current Every Day Smoker    Packs/day: 1.00    Start date: 30  . Smokeless tobacco: Never Used  Substance Use Topics  . Alcohol use: Yes    Alcohol/week: 0.0 standard drinks    Comment: 1 beer weekly      Objective:    There were no vitals taken for this visit. There were no vitals filed for this visit.There is no height or weight on file to calculate BMI.   Physical Exam Vitals reviewed.  Constitutional:      General: He is not in acute distress. HENT:     Mouth/Throat:     Comments: Sounded congested over the phone Pulmonary:     Effort: No respiratory distress.  Neurological:     Mental Status: He is alert.      No results found for any visits on 02/17/19.     Assessment & Plan    1. Suspected COVID-19 virus infection Girlfriend recently diagnosed. He started showing symptoms on Tuesday 02/15/19. Highest fever was 101 F on Tuesday night. Has been using tylenol only. Advised to start Mucinex. List of OTC medications and vitamins that can help was sent through [facility] on mychart. Work note provided as well to keep him out of work until 02/26/19 per CDC guidelines. Will send in albuterol inhaler and tessalon perles for cough. Advised to call if worsening.  - benzonatate (TESSALON) 200 MG capsule; Take 1 capsule (200 mg total) by mouth 3 (three) times daily as needed.  Dispense: 30 capsule; Refill: 0 - albuterol (VENTOLIN HFA) 108 (90 Base) MCG/ACT inhaler; Inhale 2 puffs into the lungs every 6 (six) hours as needed for wheezing or shortness of breath.  Dispense: 18 g; Refill: 1    I discussed the assessment and treatment plan with the patient. The patient was provided an opportunity to ask questions and all were answered. The patient agreed with the plan and demonstrated an understanding of the instructions.   The patient was advised to call back or seek an in-person evaluation if the symptoms worsen or if the condition fails to improve as anticipated.  I provided 10 minutes of non-face-to-face time during this encounter.      Mar Daring, PA-C  [facility]

## 2019-03-07 DIAGNOSIS — M546 Pain in thoracic spine: Secondary | ICD-10-CM | POA: Diagnosis not present

## 2019-03-07 DIAGNOSIS — M791 Myalgia, unspecified site: Secondary | ICD-10-CM | POA: Diagnosis not present

## 2019-03-07 DIAGNOSIS — G894 Chronic pain syndrome: Secondary | ICD-10-CM | POA: Diagnosis not present

## 2019-03-23 ENCOUNTER — Other Ambulatory Visit: Payer: Self-pay | Admitting: Family Medicine

## 2019-03-23 DIAGNOSIS — N529 Male erectile dysfunction, unspecified: Secondary | ICD-10-CM

## 2019-03-23 NOTE — Telephone Encounter (Signed)
Requested medication (s) are due for refill today:  Yes  Requested medication (s) are on the active medication list:   Yes  Future visit scheduled:   No   Last ordered: 02/26/2018  #8  5 refills.     It is noted on 02/17/2019 he is no longer taking this.    Sent to provider for review for refill.  Also not had a valid encounter within the last 12 months.   Requested Prescriptions  Pending Prescriptions Disp Refills   sildenafil (VIAGRA) 100 MG tablet [Pharmacy Med Name: SILDENAFIL 100MG  TABLETS] 8 tablet 5    Sig: TAKE 1 TABLET BY MOUTH DAILY AS NEEDED      Urology: Erectile Dysfunction Agents Failed - 03/23/2019  1:09 PM      Failed - Valid encounter within last 12 months    Recent Outpatient Visits           1 month ago Suspected COVID-19 virus infection   [facility] [place], [place], [place]andra Bevel[place]   1 year ago Annual physical exam   [facility], [place]   1 year ago Acute bronchitis with COPD [facility])   [facility] [place], [facility], [place] C[facility]   1 year ago COPD exacerbation [facility])   [facility] [place], [place], [place]   2 years ago Acute maxillary sinusitis, recurrence not specified   [facility], [place]              Passed - Last BP in normal range    BP Readings from Last 1 Encounters:  08/26/17 124/68

## 2019-03-25 MED ORDER — TADALAFIL 20 MG PO TABS: 20.0000 mg | ORAL_TABLET | Freq: Every day | ORAL | 5 refills | Status: DC | PRN

## 2019-03-25 NOTE — Telephone Encounter (Signed)
Patient states he is taking the Viagra. He is requesting the medication be switched back to Cialis. He reports Viagra gives him a headache. Please review.

## 2019-04-22 DIAGNOSIS — M546 Pain in thoracic spine: Secondary | ICD-10-CM | POA: Diagnosis not present

## 2019-04-22 DIAGNOSIS — G894 Chronic pain syndrome: Secondary | ICD-10-CM | POA: Diagnosis not present

## 2019-04-22 DIAGNOSIS — Z6826 Body mass index (BMI) 26.0-26.9, adult: Secondary | ICD-10-CM | POA: Diagnosis not present

## 2019-04-22 DIAGNOSIS — Z79899 Other long term (current) drug therapy: Secondary | ICD-10-CM | POA: Diagnosis not present

## 2019-04-22 DIAGNOSIS — M791 Myalgia, unspecified site: Secondary | ICD-10-CM | POA: Diagnosis not present

## 2019-05-30 DIAGNOSIS — M791 Myalgia, unspecified site: Secondary | ICD-10-CM | POA: Diagnosis not present

## 2019-05-30 DIAGNOSIS — M546 Pain in thoracic spine: Secondary | ICD-10-CM | POA: Diagnosis not present

## 2019-05-30 DIAGNOSIS — G894 Chronic pain syndrome: Secondary | ICD-10-CM | POA: Diagnosis not present

## 2019-06-24 NOTE — Progress Notes (Signed)
Complete physical exam   Patient: Jason Gutierrez   DOB: 17-Feb-1965   54 y.o. Male  MRN: [id number] Visit Date: 06/27/2019  Today's healthcare provider: Mila Merry, MD   Chief Complaint  Patient presents with  . Annual Exam  . Hyperlipidemia   Velora Mediate as a scribe for Mila Merry, MD.,have documented all relevant documentation on the behalf of Mila Merry, MD,as directed by  Mila Merry, MD while in the presence of Mila Merry, MD.  Subjective    Jason Gutierrez is a 54 y.o. male who presents today for a complete physical exam.  He reports consuming a general diet. The patient has a physically strenuous job, but has no regular exercise apart from work.  He generally feels well. He reports sleeping well. He does not have additional problems to discuss today.   HPI  Lipid/Cholesterol, Follow-up  Last lipid panel Other pertinent labs  Lab Results  Component Value Date   CHOL 166 09/29/2011   HDL 30 (A) 09/29/2011   LDLCALC 87 09/29/2011   TRIG 246 (A) 09/29/2011   Lab Results  Component Value Date   ALT 33 12/18/2013   AST 17 12/18/2013   PLT 301 12/18/2013     He was last seen for this 08/26/2017  Management since that visit includes no change.  He reports good compliance with treatment. He is not having side effects.   Symptoms: No chest pain No chest pressure/discomfort  No dyspnea No lower extremity edema  No numbness or tingling of extremity No orthopnea  No palpitations No paroxysmal nocturnal dyspnea  No speech difficulty No syncope   Current diet: in general, a "healthy" diet   Current exercise: no regular exercise  The ASCVD Risk score Denman George DC Jr., et al., 2013) failed to calculate for the following reasons:   Cannot find a previous HDL lab   Cannot find a previous total cholesterol lab  --------------------------------------------------------------------------------------------------- Has chronic back and shoulder  pain taking hydrocodone/apap taking meloxicam as needed.   Past Medical History:  Diagnosis Date  . History of pneumonia 10/26/2014   Past Surgical History:  Procedure Laterality Date  . Lumbar spine x ray  08/13/2009   Mild DDD L3-L4  . MRI Lumbar spine  07/05/2007   small central HNP at C4-C5 effaces anterior cervical cord. Mild encroachment at neural forminal at C5-C6, C6-C7, and C7-T1 due to annular buldge  . REPLACEMENT TOTAL KNEE  1980   Social History   Socioeconomic History  . Marital status: Married    Spouse name: Not on file  . Number of children: 3  . Years of education: Not on file  . Highest education level: Not on file  Occupational History  . Occupation: Public affairs consultant    Comment: Works at  [employer]  . Smoking status: Current Every Day Smoker    Packs/day: 1.00    Start date: 63  . Smokeless tobacco: Never Used  Substance and Sexual Activity  . Alcohol use: Yes    Alcohol/week: 0.0 standard drinks    Comment: 1 beer weekly  . Drug use: No  . Sexual activity: Not on file  Other Topics Concern  . Not on file  Social History Narrative  . Not on file   Social Determinants of Health   Financial Resource Strain:   . Difficulty of Paying Living Expenses:   Food Insecurity:   . Worried About Programme researcher, broadcasting/film/video in the Last Year:   .  Ran Out of Food in the Last Year:   Transportation Needs:   . Freight forwarder (Medical):   Marland Kitchen Lack of Transportation (Non-Medical):   Physical Activity:   . Days of Exercise per Week:   . Minutes of Exercise per Session:   Stress:   . Feeling of Stress :   Social Connections:   . Frequency of Communication with Friends and Family:   . Frequency of Social Gatherings with Friends and Family:   . Attends Religious Services:   . Active Member of Clubs or Organizations:   . Attends Banker Meetings:   Marland Kitchen Marital Status:   Intimate Partner Violence:   . Fear of Current or Ex-Partner:   .  Emotionally Abused:   Marland Kitchen Physically Abused:   . Sexually Abused:    Family Status  Relation Name Status  . Mother  Deceased  . Father  Deceased  . Brother  Deceased at age 83  . Mat Uncle  Deceased  . Brother  Alive  . Brother  Alive  . Brother  Alive  . Brother  (Not Specified)   Family History  Problem Relation Age of Onset  . Osteoporosis Mother   . Heart attack Father   . Stroke Father   . Heart attack Brother 43  . Bone cancer Maternal Uncle   . CAD Brother   . CAD Brother   . CAD Brother    Allergies  Allergen Reactions  . Augmentin [Amoxicillin-Pot Clavulanate] Diarrhea and Nausea And Vomiting    Has no adverse reaction from amoxicillin  . Levitra [Vardenafil]   . Penicillins   . Vicodin [Hydrocodone-Acetaminophen]     Patient Care Team: Malva Limes, MD as PCP - General (Family Medicine) Annice Needy, MD as Referring Physician (Vascular Surgery)   Medications: Outpatient Medications Prior to Visit  Medication Sig  . albuterol (VENTOLIN HFA) 108 (90 Base) MCG/ACT inhaler Inhale 2 puffs into the lungs every 6 (six) hours as needed for wheezing or shortness of breath.  Marland Kitchen atorvastatin (LIPITOR) 40 MG tablet Take 40 mg by mouth daily.  . cyclobenzaprine (FLEXERIL) 10 MG tablet TK 1 T PO QHS  . HYDROcodone-acetaminophen (NORCO) 7.5-325 MG tablet Take 1 tablet by mouth every 6 (six) hours as needed. For pain  . meloxicam (MOBIC) 15 MG tablet Take 1 tablet by mouth daily.  . tadalafil (CIALIS) 20 MG tablet Take 1 tablet (20 mg total) by mouth daily as needed for erectile dysfunction.  Marland Kitchen zolpidem (AMBIEN) 10 MG tablet Take 10 mg by mouth at bedtime as needed for sleep. 0.5-1 tablet  at bed time  . [DISCONTINUED] ondansetron (ZOFRAN) 4 MG tablet Take 1 tablet (4 mg total) by mouth every 8 (eight) hours as needed for nausea or vomiting.  . sildenafil (VIAGRA) 100 MG tablet TAKE 1 TABLET BY MOUTH AS NEEDED. MAX 1 TABLET DAILY (Patient not taking: Reported on 02/17/2019)   . [DISCONTINUED] benzonatate (TESSALON) 200 MG capsule Take 1 capsule (200 mg total) by mouth 3 (three) times daily as needed.   No facility-administered medications prior to visit.    Review of Systems  Constitutional: Negative.   HENT: Negative.   Eyes: Negative.   Respiratory: Negative.   Cardiovascular: Negative.   Gastrointestinal: Negative.   Endocrine: Negative.   Genitourinary: Negative.   Musculoskeletal: Negative.   Skin: Negative.   Allergic/Immunologic: Negative.   Neurological: Negative.   Hematological: Negative.   Psychiatric/Behavioral: Negative.  Objective    BP 109/69 (BP Location: Right Arm, Patient Position: Sitting, Cuff Size: Normal)   Pulse 94   Temp (!) 97.3 F (36.3 C) (Temporal)   Ht 5\' 8"  (1.727 m)   Wt 177 lb 9.6 oz (80.6 kg)   BMI 27.00 kg/m    Physical Exam  BP 109/69 (BP Location: Right Arm, Patient Position: Sitting, Cuff Size: Normal)   Pulse 94   Temp (!) 97.3 F (36.3 C) (Temporal)   Ht 5\' 8"  (1.727 m)   Wt 177 lb 9.6 oz (80.6 kg)   BMI 27.00 kg/m   General Appearance:    Alert, cooperative, no distress, appears stated age  Head:    Normocephalic, without obvious abnormality, atraumatic  Eyes:    PERRL, conjunctiva/corneas clear, EOM's intact, fundi    benign, both eyes       Ears:    Normal TM's and external ear canals, both ears  Nose:   Nares normal, septum midline, mucosa normal, no drainage   or sinus tenderness  Throat:   Lips, mucosa, and tongue normal; teeth and gums normal  Neck:   Supple, symmetrical, trachea midline, no adenopathy;       thyroid:  No enlargement/tenderness/nodules; no carotid   bruit or JVD  Back:     Symmetric, no curvature, ROM normal, no CVA tenderness  Lungs:     Clear to auscultation bilaterally, respirations unlabored  Chest wall:    No tenderness or deformity  Heart:    Regular rate and rhythm, S1 and S2 normal, no murmur, rub   or gallop  Abdomen:     Soft, non-tender, bowel  sounds active all four quadrants,    no masses, no organomegaly  Genitalia:    Normal male without lesion, discharge or tenderness  Rectal:    Normal tone, normal prostate, no masses or tenderness;   guaiac negative stool  Extremities:   Extremities normal, atraumatic, no cyanosis or edema  Pulses:   2+ and symmetric all extremities  Skin:   Skin color, texture, turgor normal, no rashes or lesions  Lymph nodes:   Cervical, supraclavicular, and axillary nodes normal  Neurologic:   CNII-XII intact. Normal strength, sensation and reflexes      throughout     Depression Screen  PHQ 2/9 Scores 06/27/2019 08/26/2017 02/16/2017  PHQ - 2 Score 0 0 0  PHQ- 9 Score - 1 -    No results found for any visits on 06/27/19.  Assessment & Plan    Routine Health Maintenance and Physical Exam  Exercise Activities and Dietary recommendations Goals   None     Immunization History  Administered Date(s) Administered  . Influenza-Unspecified 11/30/2014, 12/04/2017  . Td 08/26/2017  . Tdap 05/28/2006    Health Maintenance  Topic Date Due  . Hepatitis C Screening  Never done  . COVID-19 Vaccine (1) Never done  . HIV Screening  Never done  . COLONOSCOPY  Never done  . INFLUENZA VACCINE  09/04/2019  . TETANUS/TDAP  08/27/2027    Discussed health benefits of physical activity, and encouraged him to engage in regular exercise appropriate for his age and condition.   1. Annual physical exam   2. Colon cancer screening  - Ambulatory referral to Gastroenterology  3. Mixed hyperlipidemia  - Lipid Profile - TSH - CBC with Differential - Comprehensive Metabolic Panel (CMET)  4. Prostate cancer screening  - PSA   5. Need for hepatitis C screening test  - Hepatitis  C Antibody  6. Encounter for screening for HIV  - HIV Antibody (routine testing w rflx)  7. Pulmonary emphysema, unspecified emphysema type (HCC) Encouraged smoking cessation  8. Chronic bilateral low back pain  without sciatica Stable on current medication of pain medicines.   9. Smoking greater than 30 pack years  - Ambulatory Referral for Lung Cancer Scre  10. Erectile dysfunction, unspecified erectile dysfunction type Stopped Cialis because it wasn't working, start back on - sildenafil (VIAGRA) 100 MG tablet; TAKE 1 TABLET BY MOUTH AS NEEDED. MAX 1 PER DAY  Dispense: 10 tablet; Refill: 5  11. Need for shingles vaccine Shingrix # 1 today       The entirety of the information documented in the History of Present Illness, Review of Systems and Physical Exam were personally obtained by me. Portions of this information were initially documented by the CMA and reviewed by me for thoroughness and accuracy.      Mila Merry, MD  [facility] [phone] (phone) [phone] (fax)  [facility]

## 2019-06-27 ENCOUNTER — Encounter: Payer: Self-pay | Admitting: Family Medicine

## 2019-06-27 ENCOUNTER — Other Ambulatory Visit: Payer: Self-pay

## 2019-06-27 ENCOUNTER — Ambulatory Visit (INDEPENDENT_AMBULATORY_CARE_PROVIDER_SITE_OTHER): Payer: BC Managed Care – PPO | Admitting: Family Medicine

## 2019-06-27 VITALS — BP 109/69 | HR 94 | Temp 97.3°F | Ht 68.0 in | Wt 177.6 lb

## 2019-06-27 DIAGNOSIS — Z1159 Encounter for screening for other viral diseases: Secondary | ICD-10-CM

## 2019-06-27 DIAGNOSIS — Z114 Encounter for screening for human immunodeficiency virus [HIV]: Secondary | ICD-10-CM | POA: Diagnosis not present

## 2019-06-27 DIAGNOSIS — Z0000 Encounter for general adult medical examination without abnormal findings: Secondary | ICD-10-CM | POA: Diagnosis not present

## 2019-06-27 DIAGNOSIS — Z1211 Encounter for screening for malignant neoplasm of colon: Secondary | ICD-10-CM

## 2019-06-27 DIAGNOSIS — N529 Male erectile dysfunction, unspecified: Secondary | ICD-10-CM | POA: Diagnosis not present

## 2019-06-27 DIAGNOSIS — J439 Emphysema, unspecified: Secondary | ICD-10-CM | POA: Diagnosis not present

## 2019-06-27 DIAGNOSIS — F17210 Nicotine dependence, cigarettes, uncomplicated: Secondary | ICD-10-CM | POA: Diagnosis not present

## 2019-06-27 DIAGNOSIS — Z23 Encounter for immunization: Secondary | ICD-10-CM | POA: Diagnosis not present

## 2019-06-27 DIAGNOSIS — Z125 Encounter for screening for malignant neoplasm of prostate: Secondary | ICD-10-CM

## 2019-06-27 DIAGNOSIS — E782 Mixed hyperlipidemia: Secondary | ICD-10-CM

## 2019-06-27 DIAGNOSIS — G8929 Other chronic pain: Secondary | ICD-10-CM

## 2019-06-27 MED ORDER — SILDENAFIL CITRATE 100 MG PO TABS: ORAL_TABLET | ORAL | 5 refills | Status: DC

## 2019-06-27 NOTE — Patient Instructions (Signed)
.   Covid-19 vaccines: The Covid vaccines have been given to hundreds of millions of people and found to be very effective and are as safe as any other vaccine.  The Johnson [employer] Johnson vaccine has been associated with very rare dangerous blood clots, but only in adult women under the age of 60.  The risk of dying from Covid infections is much higher than having a serious reaction to the vaccine.  I strongly recommend getting fully vaccinated against Covid-19.  I recommend that adult women under 60 get fully vaccinated, but the Moderna and Pfizer vaccines may be safer for those women than the Johnson & Johnson vaccine.   

## 2019-06-28 LAB — CBC WITH DIFFERENTIAL/PLATELET
Basophils Absolute: 0.1 10*3/uL (ref 0.0–0.2)
Basos: 1 %
EOS (ABSOLUTE): 0.7 10*3/uL — ABNORMAL HIGH (ref 0.0–0.4)
Eos: 6 %
Hematocrit: 39.5 % (ref 37.5–51.0)
Hemoglobin: 13.6 g/dL (ref 13.0–17.7)
Immature Grans (Abs): 0.1 10*3/uL (ref 0.0–0.1)
Immature Granulocytes: 1 %
Lymphocytes Absolute: 4 10*3/uL — ABNORMAL HIGH (ref 0.7–3.1)
Lymphs: 37 %
MCH: 30.5 pg (ref 26.6–33.0)
MCHC: 34.4 g/dL (ref 31.5–35.7)
MCV: 89 fL (ref 79–97)
Monocytes Absolute: 0.7 10*3/uL (ref 0.1–0.9)
Monocytes: 6 %
Neutrophils Absolute: 5.4 10*3/uL (ref 1.4–7.0)
Neutrophils: 49 %
Platelets: 354 10*3/uL (ref 150–450)
RBC: 4.46 x10E6/uL (ref 4.14–5.80)
RDW: 12.9 % (ref 11.6–15.4)
WBC: 10.9 10*3/uL — ABNORMAL HIGH (ref 3.4–10.8)

## 2019-06-28 LAB — PSA: Prostate Specific Ag, Serum: 1 ng/mL (ref 0.0–4.0)

## 2019-06-28 LAB — COMPREHENSIVE METABOLIC PANEL
ALT: 14 IU/L (ref 0–44)
AST: 13 IU/L (ref 0–40)
Albumin/Globulin Ratio: 1.7 (ref 1.2–2.2)
Albumin: 4.3 g/dL (ref 3.8–4.9)
Alkaline Phosphatase: 77 IU/L (ref 48–121)
BUN/Creatinine Ratio: 30 — ABNORMAL HIGH (ref 9–20)
BUN: 21 mg/dL (ref 6–24)
Bilirubin Total: <0.2 mg/dL (ref 0.0–1.2)
CO2: 20 mmol/L (ref 20–29)
Calcium: 9.1 mg/dL (ref 8.7–10.2)
Chloride: 105 mmol/L (ref 96–106)
Creatinine, Ser: 0.69 mg/dL — ABNORMAL LOW (ref 0.76–1.27)
GFR calc Af Amer: 124 mL/min/{1.73_m2} (ref >59)
GFR calc non Af Amer: 107 mL/min/{1.73_m2} (ref >59)
Globulin, Total: 2.6 g/dL (ref 1.5–4.5)
Glucose: 85 mg/dL (ref 65–99)
Potassium: 4.3 mmol/L (ref 3.5–5.2)
Sodium: 139 mmol/L (ref 134–144)
Total Protein: 6.9 g/dL (ref 6.0–8.5)

## 2019-06-28 LAB — TSH: TSH: 1.15 u[IU]/mL (ref 0.450–4.500)

## 2019-06-28 LAB — HEPATITIS C ANTIBODY: Hep C Virus Ab: <0.1 s/co ratio (ref 0.0–0.9)

## 2019-06-28 LAB — HIV ANTIBODY (ROUTINE TESTING W REFLEX): HIV Screen 4th Generation wRfx: NONREACTIVE

## 2019-06-28 LAB — LIPID PANEL
Chol/HDL Ratio: 6.7 ratio — ABNORMAL HIGH (ref 0.0–5.0)
Cholesterol, Total: 182 mg/dL (ref 100–199)
HDL: 27 mg/dL — ABNORMAL LOW (ref >39)
LDL Chol Calc (NIH): 103 mg/dL — ABNORMAL HIGH (ref 0–99)
Triglycerides: 306 mg/dL — ABNORMAL HIGH (ref 0–149)
VLDL Cholesterol Cal: 52 mg/dL — ABNORMAL HIGH (ref 5–40)

## 2019-06-30 ENCOUNTER — Telehealth: Payer: Self-pay

## 2019-06-30 ENCOUNTER — Telehealth (INDEPENDENT_AMBULATORY_CARE_PROVIDER_SITE_OTHER): Payer: Self-pay | Admitting: Gastroenterology

## 2019-06-30 DIAGNOSIS — Z1211 Encounter for screening for malignant neoplasm of colon: Secondary | ICD-10-CM

## 2019-06-30 MED ORDER — ATORVASTATIN CALCIUM 40 MG PO TABS: 40.0000 mg | ORAL_TABLET | Freq: Every day | ORAL | 3 refills | Status: DC

## 2019-06-30 NOTE — Progress Notes (Signed)
Gastroenterology Pre-Procedure Review  Request Date: Friday 07/22/19 Requesting Physician: Dr. Maximino Greenland  PATIENT REVIEW QUESTIONS: The patient responded to the following health history questions as indicated:    1. Are you having any GI issues? no 2. Do you have a personal history of Polyps? no 3. Do you have a family history of Colon Cancer or Polyps? no 4. Diabetes Mellitus? no 5. Joint replacements in the past 12 months?no 6. Major health problems in the past 3 months?no 7. Any artificial heart valves, MVP, or defibrillator?no    MEDICATIONS & ALLERGIES:    Patient reports the following regarding taking any anticoagulation/antiplatelet therapy:   Plavix, Coumadin, Eliquis, Xarelto, Lovenox, Pradaxa, Brilinta, or Effient? no Aspirin? no  Patient confirms/reports the following medications:  Current Outpatient Medications  Medication Sig Dispense Refill  . albuterol (VENTOLIN HFA) 108 (90 Base) MCG/ACT inhaler Inhale 2 puffs into the lungs every 6 (six) hours as needed for wheezing or shortness of breath. 18 g 1  . atorvastatin (LIPITOR) 40 MG tablet Take 40 mg by mouth daily.    . cyclobenzaprine (FLEXERIL) 10 MG tablet TK 1 T PO QHS  2  . HYDROcodone-acetaminophen (NORCO) 7.5-325 MG tablet Take 1 tablet by mouth every 6 (six) hours as needed. For pain  0  . meloxicam (MOBIC) 15 MG tablet Take 1 tablet by mouth daily.  5  . sildenafil (VIAGRA) 100 MG tablet TAKE 1 TABLET BY MOUTH AS NEEDED. MAX 1 PER DAY 10 tablet 5   No current facility-administered medications for this visit.    Patient confirms/reports the following allergies:  Allergies  Allergen Reactions  . Augmentin [Amoxicillin-Pot Clavulanate] Diarrhea and Nausea And Vomiting    Has no adverse reaction from amoxicillin  . Levitra [Vardenafil]   . Penicillins   . Vicodin [Hydrocodone-Acetaminophen]     No orders of the defined types were placed in this encounter.   AUTHORIZATION INFORMATION Primary  Insurance: 1D#: Group #:  Secondary Insurance: 1D#: Group #:  SCHEDULE INFORMATION: Date: Friday 07/22/19 Time: Location:[facility]

## 2019-06-30 NOTE — Telephone Encounter (Signed)
Patient advised. He states he has not been taking Atorvastatin. He reports that he tolerated the medication well in the past, but just stopped taking it after the medication ran out. He is willing to restart medication, but says he needs a refill sent into the pharmacy. Please advise if you want him to restart medication or change to something else.   Pharmacy: [facility] [address] AT [address]

## 2019-06-30 NOTE — Telephone Encounter (Signed)
-----   Message from Malva Limes, MD sent at 06/29/2019 12:11 PM EDT ----- Triglycerides are very high at 206. Is he still taking atorvastatin. It's on his medication list, but I don't see recent refill. If not he needs to start back on it.  Rest of labs are normal.

## 2019-07-01 DIAGNOSIS — G894 Chronic pain syndrome: Secondary | ICD-10-CM | POA: Diagnosis not present

## 2019-07-01 DIAGNOSIS — M791 Myalgia, unspecified site: Secondary | ICD-10-CM | POA: Diagnosis not present

## 2019-07-01 DIAGNOSIS — M546 Pain in thoracic spine: Secondary | ICD-10-CM | POA: Diagnosis not present

## 2019-07-11 ENCOUNTER — Telehealth: Payer: Self-pay | Admitting: *Deleted

## 2019-07-11 NOTE — Telephone Encounter (Signed)
Received referral for low dose lung cancer screening CT scan. Message left at phone number listed in EMR for patient to call me back to facilitate scheduling scan.  

## 2019-07-20 ENCOUNTER — Other Ambulatory Visit: Payer: BC Managed Care – PPO | Attending: Gastroenterology

## 2019-07-22 ENCOUNTER — Ambulatory Visit
Admission: RE | Admit: 2019-07-22 | Payer: BC Managed Care – PPO | Source: Home / Self Care | Admitting: Gastroenterology

## 2019-07-22 ENCOUNTER — Encounter: Admission: RE | Payer: Self-pay | Source: Home / Self Care

## 2019-07-22 SURGERY — COLONOSCOPY WITH PROPOFOL
Anesthesia: General

## 2019-08-03 DIAGNOSIS — M7918 Myalgia, other site: Secondary | ICD-10-CM | POA: Diagnosis not present

## 2019-08-03 DIAGNOSIS — G894 Chronic pain syndrome: Secondary | ICD-10-CM | POA: Diagnosis not present

## 2019-08-03 DIAGNOSIS — M546 Pain in thoracic spine: Secondary | ICD-10-CM | POA: Diagnosis not present

## 2019-09-01 ENCOUNTER — Encounter: Payer: Self-pay | Admitting: *Deleted

## 2019-09-01 ENCOUNTER — Telehealth: Payer: Self-pay | Admitting: *Deleted

## 2019-09-01 DIAGNOSIS — M791 Myalgia, unspecified site: Secondary | ICD-10-CM | POA: Diagnosis not present

## 2019-09-01 DIAGNOSIS — M546 Pain in thoracic spine: Secondary | ICD-10-CM | POA: Diagnosis not present

## 2019-09-01 DIAGNOSIS — G894 Chronic pain syndrome: Secondary | ICD-10-CM | POA: Diagnosis not present

## 2019-09-01 DIAGNOSIS — M545 Low back pain: Secondary | ICD-10-CM | POA: Diagnosis not present

## 2019-09-01 NOTE — Telephone Encounter (Signed)
Received referral for low dose lung cancer screening CT scan. Message left at phone number listed in EMR for patient to call me back to facilitate scheduling scan.  

## 2019-09-28 DIAGNOSIS — M545 Low back pain: Secondary | ICD-10-CM | POA: Diagnosis not present

## 2019-09-28 DIAGNOSIS — M791 Myalgia, unspecified site: Secondary | ICD-10-CM | POA: Diagnosis not present

## 2019-09-28 DIAGNOSIS — G894 Chronic pain syndrome: Secondary | ICD-10-CM | POA: Diagnosis not present

## 2019-09-28 DIAGNOSIS — M546 Pain in thoracic spine: Secondary | ICD-10-CM | POA: Diagnosis not present

## 2019-10-25 ENCOUNTER — Telehealth: Payer: Self-pay

## 2019-10-25 NOTE — Telephone Encounter (Signed)
Contacted patient for lung CT screening clinic based on referral from Dr. Sherrie Mustache.  Message left with patient to call Glenna Fellows, lung navigator to make appt for scan.

## 2019-10-26 ENCOUNTER — Encounter: Payer: Self-pay | Admitting: *Deleted

## 2019-10-31 DIAGNOSIS — G894 Chronic pain syndrome: Secondary | ICD-10-CM | POA: Diagnosis not present

## 2019-10-31 DIAGNOSIS — Z79899 Other long term (current) drug therapy: Secondary | ICD-10-CM | POA: Diagnosis not present

## 2019-10-31 DIAGNOSIS — M545 Low back pain: Secondary | ICD-10-CM | POA: Diagnosis not present

## 2019-10-31 DIAGNOSIS — M546 Pain in thoracic spine: Secondary | ICD-10-CM | POA: Diagnosis not present

## 2019-10-31 DIAGNOSIS — M791 Myalgia, unspecified site: Secondary | ICD-10-CM | POA: Diagnosis not present

## 2019-11-28 DIAGNOSIS — G894 Chronic pain syndrome: Secondary | ICD-10-CM | POA: Diagnosis not present

## 2019-11-28 DIAGNOSIS — M4726 Other spondylosis with radiculopathy, lumbar region: Secondary | ICD-10-CM | POA: Diagnosis not present

## 2019-11-28 DIAGNOSIS — M546 Pain in thoracic spine: Secondary | ICD-10-CM | POA: Diagnosis not present

## 2019-11-28 DIAGNOSIS — M791 Myalgia, unspecified site: Secondary | ICD-10-CM | POA: Diagnosis not present

## 2020-01-02 DIAGNOSIS — G894 Chronic pain syndrome: Secondary | ICD-10-CM | POA: Diagnosis not present

## 2020-01-02 DIAGNOSIS — M791 Myalgia, unspecified site: Secondary | ICD-10-CM | POA: Diagnosis not present

## 2020-01-02 DIAGNOSIS — M4726 Other spondylosis with radiculopathy, lumbar region: Secondary | ICD-10-CM | POA: Diagnosis not present

## 2020-01-02 DIAGNOSIS — M546 Pain in thoracic spine: Secondary | ICD-10-CM | POA: Diagnosis not present

## 2020-01-25 DIAGNOSIS — M791 Myalgia, unspecified site: Secondary | ICD-10-CM | POA: Diagnosis not present

## 2020-01-25 DIAGNOSIS — M4726 Other spondylosis with radiculopathy, lumbar region: Secondary | ICD-10-CM | POA: Diagnosis not present

## 2020-01-25 DIAGNOSIS — M546 Pain in thoracic spine: Secondary | ICD-10-CM | POA: Diagnosis not present

## 2020-01-25 DIAGNOSIS — G894 Chronic pain syndrome: Secondary | ICD-10-CM | POA: Diagnosis not present

## 2020-03-08 DIAGNOSIS — G894 Chronic pain syndrome: Secondary | ICD-10-CM | POA: Diagnosis not present

## 2020-03-08 DIAGNOSIS — M4726 Other spondylosis with radiculopathy, lumbar region: Secondary | ICD-10-CM | POA: Diagnosis not present

## 2020-03-08 DIAGNOSIS — M791 Myalgia, unspecified site: Secondary | ICD-10-CM | POA: Diagnosis not present

## 2020-03-08 DIAGNOSIS — M546 Pain in thoracic spine: Secondary | ICD-10-CM | POA: Diagnosis not present

## 2020-03-16 IMAGING — CT CT CHEST W/O CM
2 of 4 series · 15 of 36 positions shown, 18 images · non-contrast
Comparison: 05/19/2014

CLINICAL DATA: Abnormal findings on diagnostic imaging of lung

EXAM:
CT CHEST WITHOUT CONTRAST
TECHNIQUE: Multidetector CT imaging of the chest was performed following the
standard protocol without IV contrast.

[Series 2: chest · axial · 0.72mm/px · z∈[-1360,-1088]mm · 12 of 162 slices shown, 15 images (1 of 2)]
[im 13/162  mediastinal]
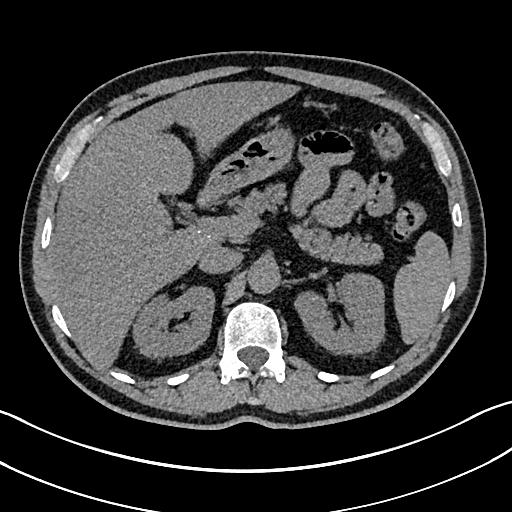
[im 13/162  lung]
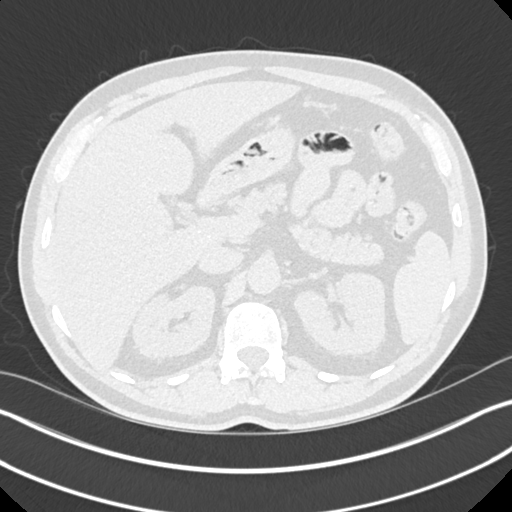
[im 25/162  lung]
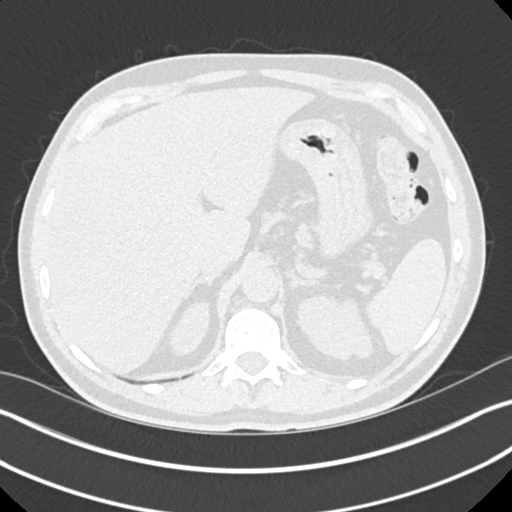
[im 38/162  lung]
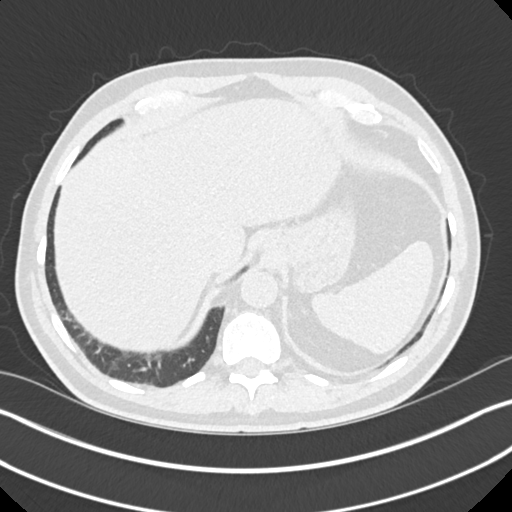
[im 50/162  lung]
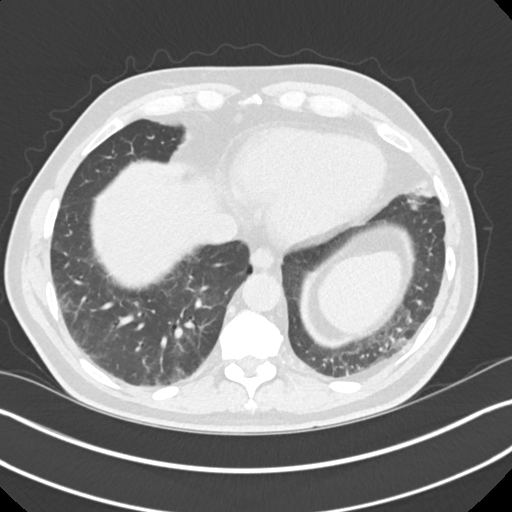
[im 62/162  mediastinal]
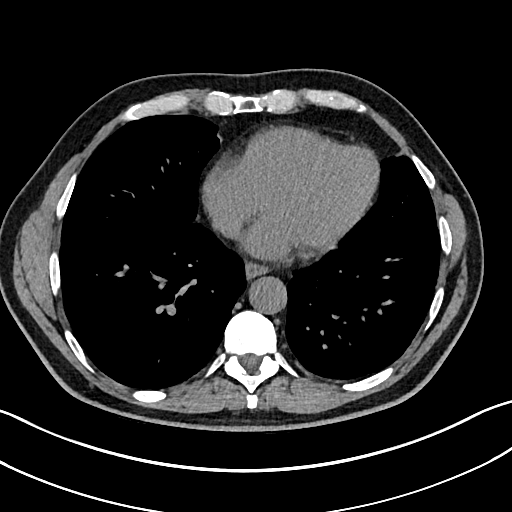
[im 62/162  lung]
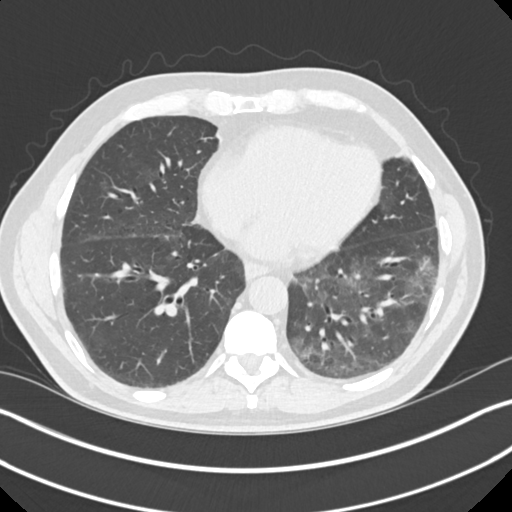
[im 75/162  lung]
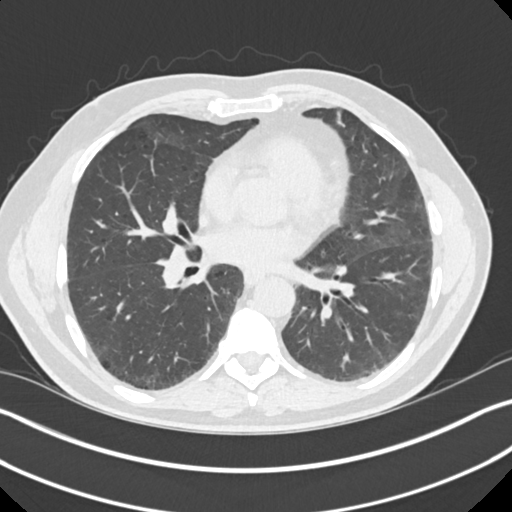
[im 87/162  lung]
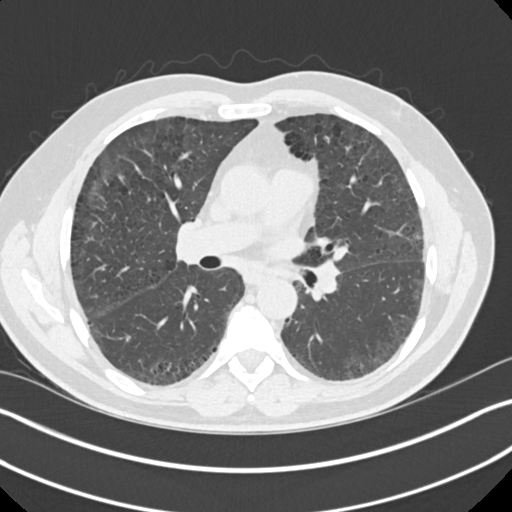
[im 100/162  lung]
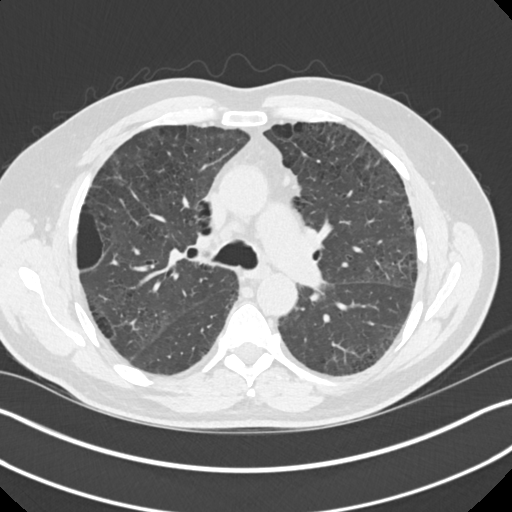
[im 112/162  mediastinal]
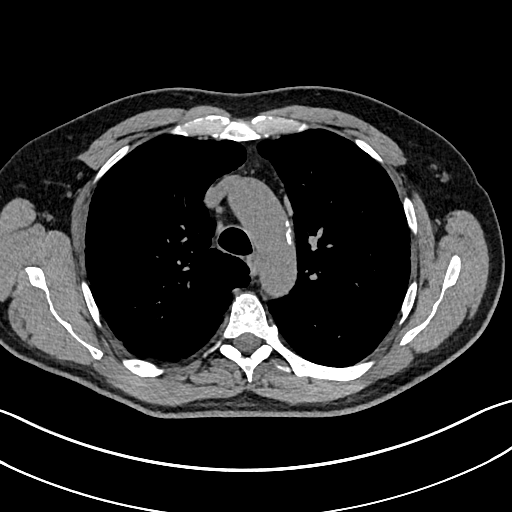
[im 112/162  lung]
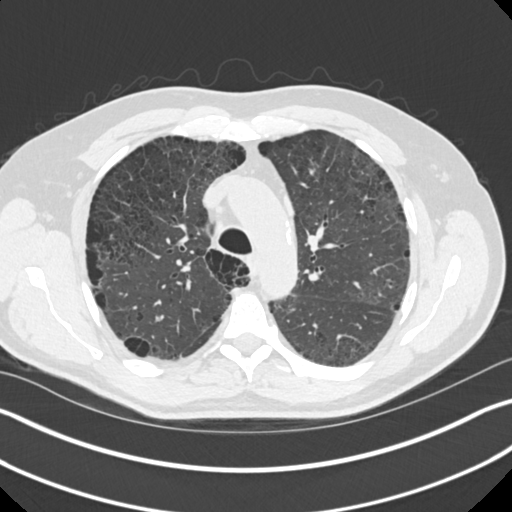
[im 124/162  lung]
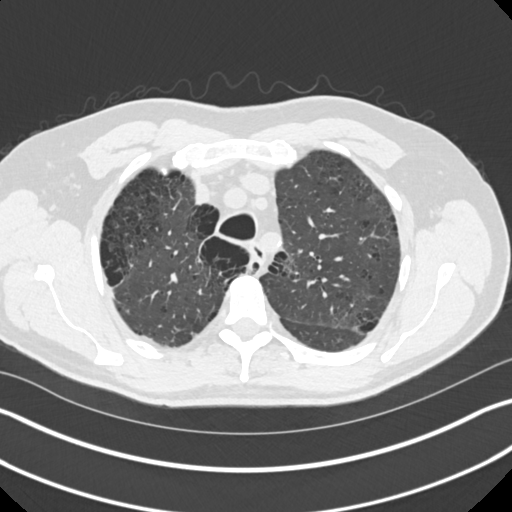
[im 137/162  lung]
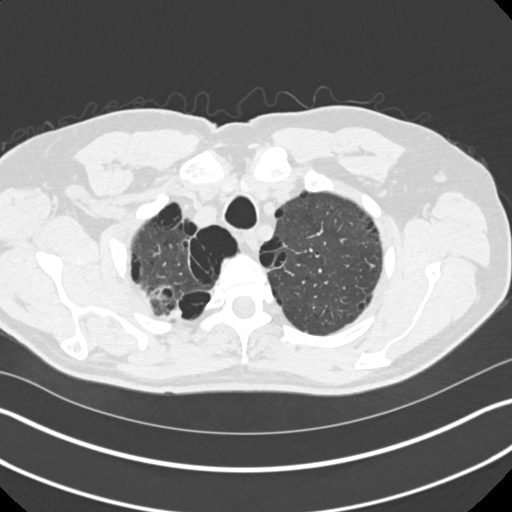
[im 149/162  lung]
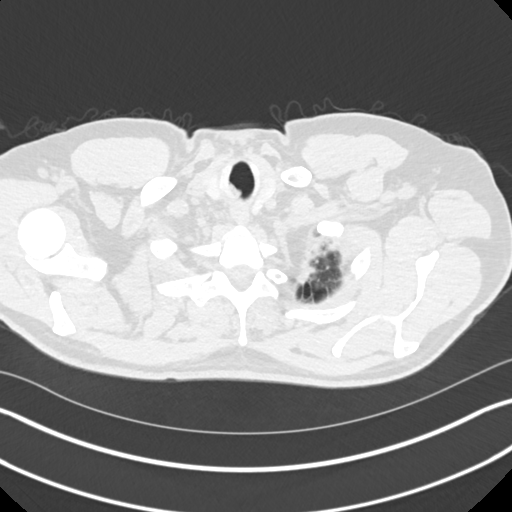

[Series 5: chest · coronal · 0.63mm/px · 3 of 145 slices shown (2 of 2)]
[im 29/145  lung]
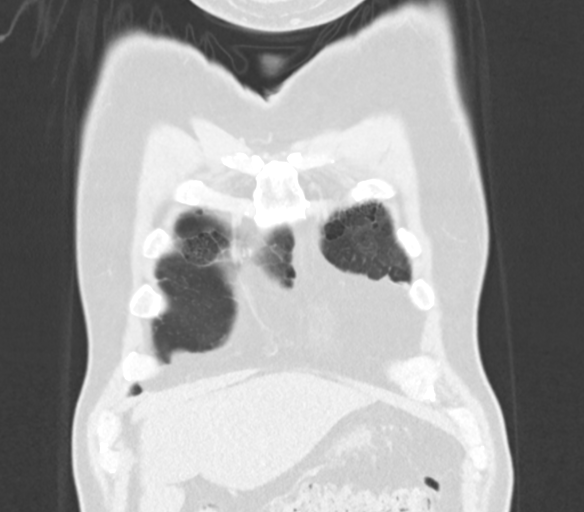
[im 58/145  lung]
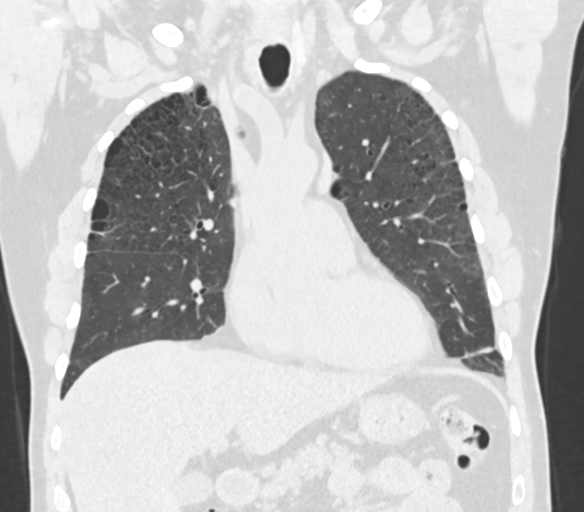
[im 87/145  lung]
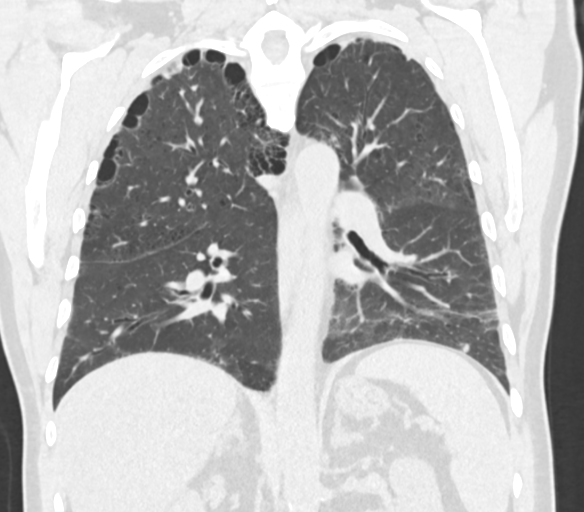

[15 of 36 positions shown; findings below may reference images not displayed]

FINDINGS: Cardiovascular: Normal heart size. No pericardial effusion.
Atherosclerotic calcification of the aorta

Mediastinum/Nodes: Negative for adenopathy. 12 mm thyroid nodule on
the left, below size threshold for sonographic follow-up
recommendation per consensus guidelines.

Lungs/Pleura: Paraseptal and centrilobular emphysema. Mild
generalized interstitial coarsening. Patchy linear and ground-glass
densities at the bases is best attributed to atelectasis. There is
no edema, consolidation, effusion, or pneumothorax.

7 x 8 mm smooth subpleural nodule along the minor fissure is size
stable and consistent with lymph node.

6 mm nodule at the right apex, stable when allowing for differences
in slice selection.

5 mm average diameter in the right upper lobe on [DATE] is size stable
when remeasured in a similar fashion.

No new or growing pulmonary nodule.

Upper Abdomen: Negative

Musculoskeletal: No acute or aggressive finding
IMPRESSION: 1. Small pulmonary nodules that are stable from 5035 and considered
benign.
2. Centrilobular and paraseptal emphysema.

## 2020-04-06 DIAGNOSIS — M4726 Other spondylosis with radiculopathy, lumbar region: Secondary | ICD-10-CM | POA: Diagnosis not present

## 2020-04-06 DIAGNOSIS — M546 Pain in thoracic spine: Secondary | ICD-10-CM | POA: Diagnosis not present

## 2020-04-06 DIAGNOSIS — M791 Myalgia, unspecified site: Secondary | ICD-10-CM | POA: Diagnosis not present

## 2020-04-06 DIAGNOSIS — G894 Chronic pain syndrome: Secondary | ICD-10-CM | POA: Diagnosis not present

## 2020-04-06 DIAGNOSIS — Z79899 Other long term (current) drug therapy: Secondary | ICD-10-CM | POA: Diagnosis not present

## 2020-05-02 DIAGNOSIS — G894 Chronic pain syndrome: Secondary | ICD-10-CM | POA: Diagnosis not present

## 2020-05-02 DIAGNOSIS — M791 Myalgia, unspecified site: Secondary | ICD-10-CM | POA: Diagnosis not present

## 2020-05-02 DIAGNOSIS — M4726 Other spondylosis with radiculopathy, lumbar region: Secondary | ICD-10-CM | POA: Diagnosis not present

## 2020-05-02 DIAGNOSIS — M546 Pain in thoracic spine: Secondary | ICD-10-CM | POA: Diagnosis not present

## 2020-06-05 DIAGNOSIS — M546 Pain in thoracic spine: Secondary | ICD-10-CM | POA: Diagnosis not present

## 2020-06-05 DIAGNOSIS — M4726 Other spondylosis with radiculopathy, lumbar region: Secondary | ICD-10-CM | POA: Diagnosis not present

## 2020-06-05 DIAGNOSIS — M791 Myalgia, unspecified site: Secondary | ICD-10-CM | POA: Diagnosis not present

## 2020-06-05 DIAGNOSIS — G894 Chronic pain syndrome: Secondary | ICD-10-CM | POA: Diagnosis not present

## 2020-07-05 DIAGNOSIS — M546 Pain in thoracic spine: Secondary | ICD-10-CM | POA: Diagnosis not present

## 2020-07-05 DIAGNOSIS — M791 Myalgia, unspecified site: Secondary | ICD-10-CM | POA: Diagnosis not present

## 2020-07-05 DIAGNOSIS — G894 Chronic pain syndrome: Secondary | ICD-10-CM | POA: Diagnosis not present

## 2020-07-05 DIAGNOSIS — M4726 Other spondylosis with radiculopathy, lumbar region: Secondary | ICD-10-CM | POA: Diagnosis not present

## 2020-07-13 ENCOUNTER — Telehealth: Payer: Self-pay

## 2020-07-13 NOTE — Telephone Encounter (Signed)
Copied from CRM [id number]. Topic: Appointment Scheduling - Scheduling Inquiry for Clinic >> Jul 13, 2020  9:16 AM Jason Gutierrez, [name] wrote: Reason for CRM: Patient called and would like to know if he can be worked in today due to a bad cold for 4 days. He cleaned his work cooler and been sick since then. Please call patient back, thanks.

## 2020-07-13 NOTE — Telephone Encounter (Signed)
We do not have any openings.  Pt is going to do a MyChart visit.   Thanks,   -Vernona Rieger

## 2020-08-02 DIAGNOSIS — G894 Chronic pain syndrome: Secondary | ICD-10-CM | POA: Diagnosis not present

## 2020-08-02 DIAGNOSIS — M791 Myalgia, unspecified site: Secondary | ICD-10-CM | POA: Diagnosis not present

## 2020-08-02 DIAGNOSIS — M4726 Other spondylosis with radiculopathy, lumbar region: Secondary | ICD-10-CM | POA: Diagnosis not present

## 2020-08-02 DIAGNOSIS — M546 Pain in thoracic spine: Secondary | ICD-10-CM | POA: Diagnosis not present

## 2020-08-30 DIAGNOSIS — M791 Myalgia, unspecified site: Secondary | ICD-10-CM | POA: Diagnosis not present

## 2020-08-30 DIAGNOSIS — M4726 Other spondylosis with radiculopathy, lumbar region: Secondary | ICD-10-CM | POA: Diagnosis not present

## 2020-08-30 DIAGNOSIS — M546 Pain in thoracic spine: Secondary | ICD-10-CM | POA: Diagnosis not present

## 2020-08-30 DIAGNOSIS — G894 Chronic pain syndrome: Secondary | ICD-10-CM | POA: Diagnosis not present

## 2020-09-18 ENCOUNTER — Other Ambulatory Visit: Payer: Self-pay | Admitting: Family Medicine

## 2020-09-18 DIAGNOSIS — N529 Male erectile dysfunction, unspecified: Secondary | ICD-10-CM

## 2020-09-18 NOTE — Telephone Encounter (Signed)
  Notes to clinic:  script last filled on 04/13/2020 Review for continued use and refill    Requested Prescriptions  Pending Prescriptions Disp Refills   sildenafil (VIAGRA) 100 MG tablet [Pharmacy Med Name: SILDENAFIL 100MG  TABLETS] 10 tablet 5    Sig: TAKE 1 TABLET BY MOUTH AS DIRECTED, NOT TO EXCEED 1 TABLET PER DAY     Urology: Erectile Dysfunction Agents Failed - 09/18/2020  2:42 PM      Failed - Valid encounter within last 12 months    Recent Outpatient Visits           1 year ago Annual physical exam   [facility], MD   1 year ago Suspected COVID-19 virus infection   [facility], [facility], Alessandra Bevels   3 years ago Annual physical exam   [facility], MD   3 years ago Acute bronchitis with COPD [facility])   [facility] Chrismon, [facility], PA-C   3 years ago COPD exacerbation [facility])   [facility] [place], Adriana M, M              Passed - Last BP in normal range    BP Readings from Last 1 Encounters:  06/27/19 109/69

## 2020-09-27 DIAGNOSIS — M546 Pain in thoracic spine: Secondary | ICD-10-CM | POA: Diagnosis not present

## 2020-09-27 DIAGNOSIS — G894 Chronic pain syndrome: Secondary | ICD-10-CM | POA: Diagnosis not present

## 2020-09-27 DIAGNOSIS — M4726 Other spondylosis with radiculopathy, lumbar region: Secondary | ICD-10-CM | POA: Diagnosis not present

## 2020-09-27 DIAGNOSIS — M791 Myalgia, unspecified site: Secondary | ICD-10-CM | POA: Diagnosis not present

## 2020-10-30 DIAGNOSIS — M546 Pain in thoracic spine: Secondary | ICD-10-CM | POA: Diagnosis not present

## 2020-10-30 DIAGNOSIS — M4726 Other spondylosis with radiculopathy, lumbar region: Secondary | ICD-10-CM | POA: Diagnosis not present

## 2020-10-30 DIAGNOSIS — Z79899 Other long term (current) drug therapy: Secondary | ICD-10-CM | POA: Diagnosis not present

## 2020-10-30 DIAGNOSIS — G894 Chronic pain syndrome: Secondary | ICD-10-CM | POA: Diagnosis not present

## 2020-10-30 DIAGNOSIS — M791 Myalgia, unspecified site: Secondary | ICD-10-CM | POA: Diagnosis not present

## 2020-11-27 DIAGNOSIS — G894 Chronic pain syndrome: Secondary | ICD-10-CM | POA: Diagnosis not present

## 2020-11-27 DIAGNOSIS — M4726 Other spondylosis with radiculopathy, lumbar region: Secondary | ICD-10-CM | POA: Diagnosis not present

## 2020-11-27 DIAGNOSIS — M791 Myalgia, unspecified site: Secondary | ICD-10-CM | POA: Diagnosis not present

## 2020-11-27 DIAGNOSIS — M546 Pain in thoracic spine: Secondary | ICD-10-CM | POA: Diagnosis not present

## 2020-12-22 ENCOUNTER — Other Ambulatory Visit: Payer: Self-pay | Admitting: Family Medicine

## 2020-12-22 DIAGNOSIS — N529 Male erectile dysfunction, unspecified: Secondary | ICD-10-CM

## 2020-12-22 NOTE — Telephone Encounter (Signed)
Requested medication (s) are due for refill today: yes  Requested medication (s) are on the active medication list: yes  Last refill:  09/18/20 #10  Future visit scheduled: no- called pt and LM on VM to call office to schedule appt- call back number provided  Notes to clinic:  needs appt- called and LM to call office to make appt.- please assess   Requested Prescriptions  Pending Prescriptions Disp Refills   sildenafil (VIAGRA) 100 MG tablet [Pharmacy Med Name: SILDENAFIL 100MG  TABLETS] 10 tablet 0    Sig: TAKE 1 TABLET BY MOUTH AS DIRECTED, NOT TO EXCEED 1 TABLET PER DAY     Urology: Erectile Dysfunction Agents Failed - 12/22/2020  8:02 AM      Failed - Valid encounter within last 12 months    Recent Outpatient Visits           1 year ago Annual physical exam   [facility], MD   1 year ago Suspected COVID-19 virus infection   [facility], [facility], Alessandra Bevels   3 years ago Annual physical exam   [facility], MD   3 years ago Acute bronchitis with COPD [facility])   [facility], [facility], PA-C   3 years ago COPD exacerbation [facility])   [facility], Adriana M, M              Passed - Last BP in normal range    BP Readings from Last 1 Encounters:  06/27/19 109/69

## 2020-12-31 DIAGNOSIS — M4726 Other spondylosis with radiculopathy, lumbar region: Secondary | ICD-10-CM | POA: Diagnosis not present

## 2020-12-31 DIAGNOSIS — M546 Pain in thoracic spine: Secondary | ICD-10-CM | POA: Diagnosis not present

## 2020-12-31 DIAGNOSIS — G894 Chronic pain syndrome: Secondary | ICD-10-CM | POA: Diagnosis not present

## 2021-01-25 DIAGNOSIS — M546 Pain in thoracic spine: Secondary | ICD-10-CM | POA: Diagnosis not present

## 2021-01-25 DIAGNOSIS — G894 Chronic pain syndrome: Secondary | ICD-10-CM | POA: Diagnosis not present

## 2021-01-25 DIAGNOSIS — M4726 Other spondylosis with radiculopathy, lumbar region: Secondary | ICD-10-CM | POA: Diagnosis not present

## 2021-02-20 DIAGNOSIS — M4726 Other spondylosis with radiculopathy, lumbar region: Secondary | ICD-10-CM | POA: Diagnosis not present

## 2021-02-20 DIAGNOSIS — G894 Chronic pain syndrome: Secondary | ICD-10-CM | POA: Diagnosis not present

## 2021-02-20 DIAGNOSIS — M546 Pain in thoracic spine: Secondary | ICD-10-CM | POA: Diagnosis not present

## 2021-04-05 DIAGNOSIS — M546 Pain in thoracic spine: Secondary | ICD-10-CM | POA: Diagnosis not present

## 2021-04-05 DIAGNOSIS — M4726 Other spondylosis with radiculopathy, lumbar region: Secondary | ICD-10-CM | POA: Diagnosis not present

## 2021-04-05 DIAGNOSIS — G894 Chronic pain syndrome: Secondary | ICD-10-CM | POA: Diagnosis not present

## 2021-05-07 DIAGNOSIS — M4726 Other spondylosis with radiculopathy, lumbar region: Secondary | ICD-10-CM | POA: Diagnosis not present

## 2021-05-07 DIAGNOSIS — G894 Chronic pain syndrome: Secondary | ICD-10-CM | POA: Diagnosis not present

## 2021-05-07 DIAGNOSIS — M546 Pain in thoracic spine: Secondary | ICD-10-CM | POA: Diagnosis not present

## 2021-05-07 DIAGNOSIS — Z79899 Other long term (current) drug therapy: Secondary | ICD-10-CM | POA: Diagnosis not present

## 2021-06-05 DIAGNOSIS — M546 Pain in thoracic spine: Secondary | ICD-10-CM | POA: Diagnosis not present

## 2021-06-05 DIAGNOSIS — G894 Chronic pain syndrome: Secondary | ICD-10-CM | POA: Diagnosis not present

## 2021-07-03 DIAGNOSIS — M546 Pain in thoracic spine: Secondary | ICD-10-CM | POA: Diagnosis not present

## 2021-07-03 DIAGNOSIS — M4726 Other spondylosis with radiculopathy, lumbar region: Secondary | ICD-10-CM | POA: Diagnosis not present

## 2021-07-03 DIAGNOSIS — G894 Chronic pain syndrome: Secondary | ICD-10-CM | POA: Diagnosis not present

## 2021-08-09 DIAGNOSIS — M5106 Intervertebral disc disorders with myelopathy, lumbar region: Secondary | ICD-10-CM | POA: Diagnosis not present

## 2021-08-09 DIAGNOSIS — G894 Chronic pain syndrome: Secondary | ICD-10-CM | POA: Diagnosis not present

## 2021-08-09 DIAGNOSIS — M546 Pain in thoracic spine: Secondary | ICD-10-CM | POA: Diagnosis not present

## 2021-08-09 DIAGNOSIS — M7918 Myalgia, other site: Secondary | ICD-10-CM | POA: Diagnosis not present

## 2021-08-09 DIAGNOSIS — M4726 Other spondylosis with radiculopathy, lumbar region: Secondary | ICD-10-CM | POA: Diagnosis not present

## 2021-09-05 DIAGNOSIS — G894 Chronic pain syndrome: Secondary | ICD-10-CM | POA: Diagnosis not present

## 2021-09-05 DIAGNOSIS — M546 Pain in thoracic spine: Secondary | ICD-10-CM | POA: Diagnosis not present

## 2021-09-05 DIAGNOSIS — M791 Myalgia, unspecified site: Secondary | ICD-10-CM | POA: Diagnosis not present

## 2021-10-04 DIAGNOSIS — M791 Myalgia, unspecified site: Secondary | ICD-10-CM | POA: Diagnosis not present

## 2021-10-04 DIAGNOSIS — G894 Chronic pain syndrome: Secondary | ICD-10-CM | POA: Diagnosis not present

## 2021-11-08 DIAGNOSIS — G894 Chronic pain syndrome: Secondary | ICD-10-CM | POA: Diagnosis not present

## 2021-11-08 DIAGNOSIS — M791 Myalgia, unspecified site: Secondary | ICD-10-CM | POA: Diagnosis not present

## 2021-11-08 DIAGNOSIS — Z79899 Other long term (current) drug therapy: Secondary | ICD-10-CM | POA: Diagnosis not present

## 2021-11-08 DIAGNOSIS — M546 Pain in thoracic spine: Secondary | ICD-10-CM | POA: Diagnosis not present

## 2021-11-08 DIAGNOSIS — M4726 Other spondylosis with radiculopathy, lumbar region: Secondary | ICD-10-CM | POA: Diagnosis not present

## 2021-12-06 DIAGNOSIS — M4726 Other spondylosis with radiculopathy, lumbar region: Secondary | ICD-10-CM | POA: Diagnosis not present

## 2021-12-06 DIAGNOSIS — M546 Pain in thoracic spine: Secondary | ICD-10-CM | POA: Diagnosis not present

## 2021-12-06 DIAGNOSIS — G894 Chronic pain syndrome: Secondary | ICD-10-CM | POA: Diagnosis not present

## 2022-01-06 DIAGNOSIS — G894 Chronic pain syndrome: Secondary | ICD-10-CM | POA: Diagnosis not present

## 2022-01-06 DIAGNOSIS — M546 Pain in thoracic spine: Secondary | ICD-10-CM | POA: Diagnosis not present

## 2022-01-06 DIAGNOSIS — M4726 Other spondylosis with radiculopathy, lumbar region: Secondary | ICD-10-CM | POA: Diagnosis not present

## 2024-01-21 NOTE — Discharge Instructions (Signed)

## 2024-01-21 NOTE — Anesthesia Preprocedure Evaluation (Addendum)
"                                    Anesthesia Evaluation  Patient identified by MRN, date of birth, ID band Patient awake    Reviewed: Allergy & Precautions, H&P , NPO status , Patient's Chart, lab work & pertinent test results  Airway Mallampati: III  TM Distance: >3 FB Neck ROM: Full    Dental no notable dental hx. (+) Poor Dentition, Chipped Multiple small chips on teeth:   Pulmonary COPD, Current Smoker   Pulmonary exam normal breath sounds clear to auscultation       Cardiovascular negative cardio ROS Normal cardiovascular exam Rhythm:Regular Rate:Normal     Neuro/Psych negative neurological ROS  negative psych ROS   GI/Hepatic negative GI ROS, Neg liver ROS,,,  Endo/Other  negative endocrine ROS    Renal/GU negative Renal ROS  negative genitourinary   Musculoskeletal negative musculoskeletal ROS (+) Arthritis ,    Abdominal   Peds negative pediatric ROS (+)  Hematology negative hematology ROS (+)   Anesthesia Other Findings History of pneumonia  COVID-19 Arthritis  Chronic pain syndrome Chronic bilateral thoracic back pain Chronic bilateral low back pain COPD (chronic obstructive pulmonary disease) (HCC)  Chronic bronchitis (HCC) Pulmonary emphysema (HCC     Reproductive/Obstetrics negative OB ROS                              Anesthesia Physical Anesthesia Plan  ASA: 3  Anesthesia Plan: MAC   Post-op Pain Management:    Induction: Intravenous  PONV Risk Score and Plan:   Airway Management Planned: Natural Airway and Nasal Cannula  Additional Equipment:   Intra-op Plan:   Post-operative Plan:   Informed Consent: I have reviewed the patients History and Physical, chart, labs and discussed the procedure including the risks, benefits and alternatives for the proposed anesthesia with the patient or authorized representative who has indicated his/her understanding and acceptance.     Dental  Advisory Given  Plan Discussed with: Anesthesiologist, CRNA and Surgeon  Anesthesia Plan Comments: (Patient consented for risks of anesthesia including but not limited to:  - adverse reactions to medications - damage to eyes, teeth, lips or other oral mucosa - nerve damage due to positioning  - sore throat or hoarseness - Damage to heart, brain, nerves, lungs, other parts of body or loss of life  Patient voiced understanding and assent.)         Anesthesia Quick Evaluation  "

## 2024-02-01 ENCOUNTER — Ambulatory Visit: Payer: Self-pay | Admitting: Anesthesiology

## 2024-02-01 ENCOUNTER — Other Ambulatory Visit: Payer: Self-pay

## 2024-02-01 ENCOUNTER — Ambulatory Visit: Admission: RE | Admit: 2024-02-01 | Discharge: 2024-02-01 | Disposition: A | Discharge status: Home / Self Care

## 2024-02-01 ENCOUNTER — Encounter: Admission: RE | Disposition: A | Discharge status: Home / Self Care | Payer: Self-pay | Source: Home / Self Care

## 2024-02-01 DIAGNOSIS — Z79899 Other long term (current) drug therapy: Secondary | ICD-10-CM | POA: Insufficient documentation

## 2024-02-01 DIAGNOSIS — M1990 Unspecified osteoarthritis, unspecified site: Secondary | ICD-10-CM | POA: Insufficient documentation

## 2024-02-01 DIAGNOSIS — H2511 Age-related nuclear cataract, right eye: Secondary | ICD-10-CM | POA: Insufficient documentation

## 2024-02-01 DIAGNOSIS — F17210 Nicotine dependence, cigarettes, uncomplicated: Secondary | ICD-10-CM | POA: Insufficient documentation

## 2024-02-01 DIAGNOSIS — J449 Chronic obstructive pulmonary disease, unspecified: Secondary | ICD-10-CM | POA: Insufficient documentation

## 2024-02-01 HISTORY — DX: Unspecified osteoarthritis, unspecified site: M19.90

## 2024-02-01 HISTORY — PX: CATARACT EXTRACTION W/PHACO: SHX586

## 2024-02-01 HISTORY — DX: Emphysema, unspecified: J43.9

## 2024-02-01 HISTORY — DX: Unspecified chronic bronchitis: J42

## 2024-02-01 HISTORY — DX: Chronic pain syndrome: G89.4

## 2024-02-01 HISTORY — DX: Other chronic pain: G89.29

## 2024-02-01 HISTORY — DX: Chronic obstructive pulmonary disease, unspecified: J44.9

## 2024-02-01 SURGERY — PHACOEMULSIFICATION, CATARACT, WITH IOL INSERTION
Anesthesia: Monitor Anesthesia Care | Laterality: Right

## 2024-02-01 MED ORDER — FENTANYL CITRATE (PF) 100 MCG/2ML IJ SOLN
INTRAMUSCULAR | Status: AC
  Filled 2024-02-01: qty 2

## 2024-02-01 MED ORDER — PHENYLEPHRINE HCL 10 % OP SOLN
OPHTHALMIC | Status: AC
  Filled 2024-02-01: qty 5

## 2024-02-01 MED ORDER — TETRACAINE HCL 0.5 % OP SOLN
1.0000 [drp] | OPHTHALMIC | Status: DC | PRN
  Administered 2024-02-01 (×3): 1 [drp] via OPHTHALMIC

## 2024-02-01 MED ORDER — MIDAZOLAM HCL (PF) 2 MG/2ML IJ SOLN
INTRAMUSCULAR | Status: DC | PRN
  Administered 2024-02-01 (×2): 1 mg via INTRAVENOUS

## 2024-02-01 MED ORDER — FENTANYL CITRATE (PF) 100 MCG/2ML IJ SOLN
INTRAMUSCULAR | Status: DC | PRN
  Administered 2024-02-01 (×2): 50 ug via INTRAVENOUS

## 2024-02-01 MED ORDER — SIGHTPATH DOSE#1 BSS IO SOLN
INTRAOCULAR | Status: DC | PRN
  Administered 2024-02-01: 184 mL via OPHTHALMIC

## 2024-02-01 MED ORDER — CYCLOPENTOLATE HCL 2 % OP SOLN
OPHTHALMIC | Status: AC
  Filled 2024-02-01: qty 2

## 2024-02-01 MED ORDER — MIDAZOLAM HCL 2 MG/2ML IJ SOLN
INTRAMUSCULAR | Status: AC
  Filled 2024-02-01: qty 2

## 2024-02-01 MED ORDER — PHENYLEPHRINE HCL 10 % OP SOLN
1.0000 [drp] | OPHTHALMIC | Status: DC | PRN
  Administered 2024-02-01 (×3): 1 [drp] via OPHTHALMIC

## 2024-02-01 MED ORDER — TETRACAINE HCL 0.5 % OP SOLN
OPHTHALMIC | Status: AC
  Filled 2024-02-01: qty 4

## 2024-02-01 MED ORDER — LIDOCAINE HCL (PF) 2 % IJ SOLN
INTRAOCULAR | Status: DC | PRN
  Administered 2024-02-01: 1 mL via INTRAOCULAR

## 2024-02-01 MED ORDER — CYCLOPENTOLATE HCL 2 % OP SOLN
1.0000 [drp] | OPHTHALMIC | Status: DC | PRN
  Administered 2024-02-01 (×2): 1 [drp] via OPHTHALMIC

## 2024-02-01 MED ORDER — LACTATED RINGERS IV SOLN: INTRAVENOUS | Status: DC

## 2024-02-01 MED ORDER — MOXIFLOXACIN HCL 0.5 % OP SOLN
OPHTHALMIC | Status: DC | PRN
  Administered 2024-02-01: .2 mL via OPHTHALMIC

## 2024-02-01 MED ORDER — BRIMONIDINE TARTRATE-TIMOLOL 0.2-0.5 % OP SOLN
OPHTHALMIC | Status: DC | PRN
  Administered 2024-02-01: 1 [drp] via OPHTHALMIC

## 2024-02-01 MED ORDER — SIGHTPATH DOSE#1 BSS IO SOLN
INTRAOCULAR | Status: DC | PRN
  Administered 2024-02-01: 15 mL via INTRAOCULAR

## 2024-02-01 MED ORDER — TRYPAN BLUE 0.06 % IO SOSY
PREFILLED_SYRINGE | INTRAOCULAR | Status: DC | PRN
  Administered 2024-02-01: .5 mL via INTRAOCULAR

## 2024-02-01 MED ORDER — SIGHTPATH DOSE#1 NA CHONDROIT SULF-NA HYALURON 20-15 MG/0.5ML IO SOSY
INTRAOCULAR | Status: DC | PRN
  Administered 2024-02-01: .5 mL via INTRAOCULAR

## 2024-02-01 MED ORDER — SIGHTPATH DOSE#1 NA HYALUR & NA CHOND-NA HYALUR IO KIT
PACK | INTRAOCULAR | Status: DC | PRN
  Administered 2024-02-01: 1 via OPHTHALMIC

## 2024-02-01 SURGICAL SUPPLY — 11 items
CANNULA ANT/CHMB 27G (MISCELLANEOUS) IMPLANT
CANNULA HYDRODISSEC NUC 25X7/8 (MISCELLANEOUS) ×1 IMPLANT
CLAREOIN IOL CC60WF235 IMPLANT
DRSG TEGADERM 2-3/8X2-3/4 SM (GAUZE/BANDAGES/DRESSINGS) ×1 IMPLANT
FEE CATARACT SUITE SIGHTPATH (MISCELLANEOUS) ×1 IMPLANT
GLOVE BIOGEL PI IND STRL 8 (GLOVE) ×1 IMPLANT
GLOVE SURG LX STRL 7.5 STRW (GLOVE) ×1 IMPLANT
GLOVE SURG SYN 6.5 PF PI BL (GLOVE) ×1 IMPLANT
NEEDLE FILTER BLUNT 18X1 1/2 (NEEDLE) ×1 IMPLANT
RING TENSION CAPSULAR (Miscellaneous) IMPLANT
SYR 3ML LL SCALE MARK (SYRINGE) ×1 IMPLANT

## 2024-02-01 NOTE — H&P (Signed)
 [facility]   Primary Care Physician:  Gasper Nancyann BRAVO, MD Ophthalmologist: Dr. Curtistine Fava  Pre-Procedure History & Physical: HPI:  Jason Gutierrez is a 58 y.o. male here for cataract surgery right eye.   Past Medical History:  Diagnosis Date   Arthritis    Chronic bilateral low back pain    Chronic bilateral thoracic back pain    Chronic bronchitis (HCC)    Chronic pain syndrome    COPD (chronic obstructive pulmonary disease) (HCC)    COVID-19 02/16/2019   History of pneumonia 10/26/2014   Pulmonary emphysema (HCC)     Past Surgical History:  Procedure Laterality Date   Lumbar spine x ray  08/13/2009   Mild DDD L3-L4   MRI Lumbar spine  07/05/2007   small central HNP at C4-C5 effaces anterior cervical cord. Mild encroachment at neural forminal at C5-C6, C6-C7, and C7-T1 due to annular buldge   REPLACEMENT TOTAL KNEE  1980    Prior to Admission medications  Medication Sig Start Date End Date Taking? Authorizing Provider  HYDROcodone-acetaminophen (NORCO) 7.5-325 MG tablet Take 1 tablet by mouth every 6 (six) hours as needed. For pain 02/08/17  Yes [provider]  sildenafil  (VIAGRA ) 100 MG tablet TAKE 1 TABLET BY MOUTH AS DIRECTED, NOT TO EXCEED 1 TABLET PER DAY 12/23/20   Gasper Nancyann BRAVO, MD    Allergies as of 01/14/2024 - Review Complete 06/30/2019  Allergen Reaction Noted   Augmentin  [amoxicillin -pot clavulanate] Diarrhea and Nausea And Vomiting 10/27/2014   Levitra [vardenafil]  10/26/2014   Penicillins  10/26/2014   Vicodin [hydrocodone-acetaminophen]  10/26/2014    Family History  Problem Relation Age of Onset   Osteoporosis Mother    Heart attack Father    Stroke Father    Heart attack Brother 32   Bone cancer Maternal Uncle    CAD Brother    CAD Brother    CAD Brother     Social History   Socioeconomic History   Marital status: Married    Spouse name: Not on file   Number of children: 3   Years of education: Not on file    Highest education level: Not on file  Occupational History   Occupation: Public Affairs Consultant    Comment: Works at  [employer]  Tobacco Use   Smoking status: Every Day    Current packs/day: 1.00    Average packs/day: 1 pack/day for 44.0 years (44.0 ttl pk-yrs)    Types: Cigarettes    Start date: 1982   Smokeless tobacco: Never  Vaping Use   Vaping status: Never Used  Substance and Sexual Activity   Alcohol use: Not Currently   Drug use: Yes    Types: Marijuana    Comment: when 18   Sexual activity: Not on file  Other Topics Concern   Not on file  Social History Narrative   Not on file   Social Drivers of Health   Tobacco Use: High Risk (01/25/2024)   Received from [facility]   Patient History    Smoking Tobacco Use: Every Day    Smokeless Tobacco Use: Never    Passive Exposure: Not on file  Financial Resource Strain: Low Risk (07/02/2023)   Received from [facility]   Overall Financial Resource Strain (CARDIA)    Difficulty of Paying Living Expenses: Not hard at all  Food Insecurity: No Food Insecurity (07/02/2023)   Received from [facility]   Epic    Within the past 12 months,  you worried that your food would run out before you got the money to buy more.: Never true    Within the past 12 months, the food you bought just didn't last and you didn't have money to get more.: Never true  Transportation Needs: No Transportation Needs (07/02/2023)   Received from [facility]   PRAPARE - Transportation    Lack of Transportation (Medical): No    Lack of Transportation (Non-Medical): No  Physical Activity: Not on file  Stress: Not on file  Social Connections: Not on file  Intimate Partner Violence: Not on file  Depression (EYV7-0): Not on file  Alcohol Screen: Not on file  Housing: Low Risk (07/02/2023)   Received from [facility]    In the last 12 months, was there a time when you were not able to pay the mortgage or rent on time?: No    In the past 12 months, how  many times have you moved where you were living?: 0    At any time in the past 12 months, were you homeless or living in a shelter (including now)?: No  Utilities: Not At Risk (07/02/2023)   Received from [facility]ities    Threatened with loss of utilities: No  Health Literacy: Not on file    Review of Systems: See HPI, otherwise negative ROS  Physical Exam: BP (!) 146/86   Pulse 88   Temp 98.7 F (37.1 C) (Temporal)   Ht 5' 8 (1.727 m)   Wt 73.5 kg   SpO2 96%   BMI 24.63 kg/m  General:   Alert, cooperative in NAD Head:  Normocephalic and atraumatic. Respiratory:  Normal work of breathing. Cardiovascular:  RRR  Impression/Plan: Jason Gutierrez is here for cataract surgery.  Risks, benefits, limitations, and alternatives regarding cataract surgery have been reviewed with the patient.  Questions have been answered.  All parties agreeable.   Curtistine JINNY Fava, MD  02/01/2024, 8:16 AM

## 2024-02-01 NOTE — Transfer of Care (Signed)
 Immediate Anesthesia Transfer of Care Note  Patient: Jason Gutierrez  Procedure(s) Performed: PHACOEMULSIFICATION, CATARACT, WITH IOL INSERTION 18.29, 02:01.1 (Right)  Patient Location: PACU  Anesthesia Type: MAC  Level of Consciousness: awake, alert  and patient cooperative  Airway and Oxygen Therapy: Patient Spontanous Breathing and Patient connected to supplemental oxygen  Post-op Assessment: Post-op Vital signs reviewed, Patient's Cardiovascular Status Stable, Respiratory Function Stable, Patent Airway and No signs of Nausea or vomiting  Post-op Vital Signs: Reviewed and stable  Complications: No notable events documented.

## 2024-02-01 NOTE — Op Note (Signed)
 PREOPERATIVE DIAGNOSIS:  Nuclear sclerotic cataract of the right eye.   POSTOPERATIVE DIAGNOSIS:  RIGHT EYE CATARACT   OPERATIVE PROCEDURE: Phacoemulsification with IOL Implant Right Eye   SURGEON:  Curtistine Fava, MD   ANESTHESIA:  Anesthesiologist: Ola Donny BROCKS, MD CRNA: Jahoo, Sonia, CRNA  Monitored anesthesia care. Topical tetracaine  drops followed by 2% Xylocaine  jelly applied in the preoperative holding area 0.70ml of epi-Shugarcaine was instilled in the eye following the paracentesis.   COMPLICATIONS:  None.   TECHNIQUE:   Phacoemulsification divide and conquer   DESCRIPTION OF PROCEDURE:  The patient was examined and consented in the preoperative holding area where the aforementioned topical anesthesia was applied to the right eye and then brought back to the Operating Room where the right eye was prepped and draped in the usual sterile ophthalmic fashion and a lid speculum was placed. A paracentesis was created with the side port blade and the anterior chamber was filled with epi-Shugarcaine follower by viscoelastic. A clear corneal incision was performed with the steel keratome. A continuous curvilinear capsulorrhexis was performed with a cystotome followed by the capsulorrhexis forceps. Hydrodissection and hydrodelineation were carried out with BSS on a blunt cannula. The lens was removed in a divide and conquer technique and the remaining cortical material was removed with the irrigation-aspiration handpiece. Approximately 2 clock hours of zonular weakness was noted from 12-2oclock.. The capsular bag was inflated with viscoelastic and a size 11 capsular tension ring was inserted into the capsular bag and then the +23.5 CC60WF lens was placed in the capsular bag without complication. The remaining viscoelastic was removed from the eye with the irrigation-aspiration handpiece. The wounds were hydrated. The anterior chamber was flushed with BSS and the eye was inflated to physiologic  pressure. 0.18ml of Vigamox  was placed in the anterior chamber. The wounds were found to be water tight. The eye was dressed with Combigan  and covered with a clear shield to be worn until the first postoperative day appointment. The patient was given protective glasses to wear throughout the day. The patient was also given drops with which to begin a drop regimen today and will follow-up with me in one day. Implant Name Type Inv. Item Serial No. Manufacturer Lot No. LRB No. Used Action  CLAREOIN IOL [id number]   [id number]   Right 1 Implanted   Procedures: PHACOEMULSIFICATION, CATARACT, WITH IOL INSERTION 18.29, 02:01.1 (Right)  Electronically signed: Curtistine PARAS [facility] 02/01/2024 9:00 AM

## 2024-02-01 NOTE — Anesthesia Postprocedure Evaluation (Signed)
"   Anesthesia Post Note  Patient: Jason Gutierrez  Procedure(s) Performed: PHACOEMULSIFICATION, CATARACT, WITH IOL INSERTION 18.29, 02:01.1 (Right)  Patient location during evaluation: PACU Anesthesia Type: MAC Level of consciousness: awake and alert Pain management: pain level controlled Vital Signs Assessment: post-procedure vital signs reviewed and stable Respiratory status: spontaneous breathing, nonlabored ventilation, respiratory function stable and patient connected to nasal cannula oxygen Cardiovascular status: stable and blood pressure returned to baseline Postop Assessment: no apparent nausea or vomiting Anesthetic complications: no   No notable events documented.   Last Vitals:  Vitals:   02/01/24 0901 02/01/24 0906  BP: (!) 136/90 (!) 144/93  Pulse: 85 82  Resp: 18 13  Temp: (!) 36.2 C 36.7 C  SpO2: 96% 95%    Last Pain:  Vitals:   02/01/24 0906  TempSrc:   PainSc: 0-No pain                 Timera Windt C Aggie Douse      "

## 2024-02-23 NOTE — Discharge Instructions (Signed)

## 2024-02-25 ENCOUNTER — Encounter: Admission: RE | Disposition: A | Discharge status: Home / Self Care | Payer: Self-pay | Source: Home / Self Care

## 2024-02-25 ENCOUNTER — Ambulatory Visit: Payer: Self-pay | Admitting: Anesthesiology

## 2024-02-25 ENCOUNTER — Other Ambulatory Visit: Payer: Self-pay

## 2024-02-25 ENCOUNTER — Ambulatory Visit: Admission: RE | Admit: 2024-02-25 | Discharge: 2024-02-25 | Disposition: A | Discharge status: Home / Self Care

## 2024-02-25 DIAGNOSIS — H2512 Age-related nuclear cataract, left eye: Secondary | ICD-10-CM | POA: Insufficient documentation

## 2024-02-25 DIAGNOSIS — F17210 Nicotine dependence, cigarettes, uncomplicated: Secondary | ICD-10-CM | POA: Insufficient documentation

## 2024-02-25 HISTORY — PX: CATARACT EXTRACTION W/PHACO: SHX586

## 2024-02-25 MED ORDER — PHENYLEPHRINE HCL 10 % OP SOLN
1.0000 [drp] | OPHTHALMIC | Status: DC | PRN
  Administered 2024-02-25 (×2): 1 [drp] via OPHTHALMIC

## 2024-02-25 MED ORDER — FENTANYL CITRATE (PF) 100 MCG/2ML IJ SOLN
INTRAMUSCULAR | Status: AC
  Filled 2024-02-25: qty 2

## 2024-02-25 MED ORDER — CYCLOPENTOLATE HCL 2 % OP SOLN
OPHTHALMIC | Status: AC
  Filled 2024-02-25: qty 2

## 2024-02-25 MED ORDER — TETRACAINE HCL 0.5 % OP SOLN
1.0000 [drp] | OPHTHALMIC | Status: DC | PRN
  Administered 2024-02-25 (×3): 1 [drp] via OPHTHALMIC

## 2024-02-25 MED ORDER — TETRACAINE HCL 0.5 % OP SOLN
OPHTHALMIC | Status: AC
  Filled 2024-02-25: qty 4

## 2024-02-25 MED ORDER — CYCLOPENTOLATE HCL 2 % OP SOLN
1.0000 [drp] | OPHTHALMIC | Status: DC | PRN
  Administered 2024-02-25 (×2): 1 [drp] via OPHTHALMIC

## 2024-02-25 MED ORDER — LIDOCAINE HCL (PF) 2 % IJ SOLN
INTRAOCULAR | Status: DC | PRN
  Administered 2024-02-25: 1 mL via INTRAOCULAR

## 2024-02-25 MED ORDER — MIDAZOLAM HCL (PF) 2 MG/2ML IJ SOLN
INTRAMUSCULAR | Status: DC | PRN
  Administered 2024-02-25: 2 mg via INTRAVENOUS

## 2024-02-25 MED ORDER — MOXIFLOXACIN HCL 0.5 % OP SOLN
OPHTHALMIC | Status: DC | PRN
  Administered 2024-02-25: .2 mL via OPHTHALMIC

## 2024-02-25 MED ORDER — FENTANYL CITRATE (PF) 100 MCG/2ML IJ SOLN
INTRAMUSCULAR | Status: DC | PRN
  Administered 2024-02-25 (×2): 50 ug via INTRAVENOUS

## 2024-02-25 MED ORDER — LACTATED RINGERS IV SOLN: INTRAVENOUS | Status: DC

## 2024-02-25 MED ORDER — SIGHTPATH DOSE#1 BSS IO SOLN
INTRAOCULAR | Status: DC | PRN
  Administered 2024-02-25: 15 mL via INTRAOCULAR

## 2024-02-25 MED ORDER — SIGHTPATH DOSE#1 NA HYALUR & NA CHOND-NA HYALUR IO KIT
PACK | INTRAOCULAR | Status: DC | PRN
  Administered 2024-02-25: 1 via OPHTHALMIC

## 2024-02-25 MED ORDER — BRIMONIDINE TARTRATE-TIMOLOL 0.2-0.5 % OP SOLN
OPHTHALMIC | Status: DC | PRN
  Administered 2024-02-25: 1 [drp] via OPHTHALMIC

## 2024-02-25 MED ORDER — MIDAZOLAM HCL 2 MG/2ML IJ SOLN
INTRAMUSCULAR | Status: AC
  Filled 2024-02-25: qty 2

## 2024-02-25 MED ORDER — SIGHTPATH DOSE#1 BSS IO SOLN
INTRAOCULAR | Status: DC | PRN
  Administered 2024-02-25: 80 mL via OPHTHALMIC

## 2024-02-25 MED ORDER — PHENYLEPHRINE HCL 10 % OP SOLN
OPHTHALMIC | Status: AC
  Filled 2024-02-25: qty 5

## 2024-02-25 NOTE — Anesthesia Postprocedure Evaluation (Signed)
"   Anesthesia Post Note  Patient: ELICEO GLADU  Procedure(s) Performed: PHACOEMULSIFICATION, CATARACT, WITH IOL INSERTION  10.16, 01:01.2 (Left: Eye)  Patient location during evaluation: PACU Anesthesia Type: MAC Level of consciousness: awake and alert Pain management: pain level controlled Vital Signs Assessment: post-procedure vital signs reviewed and stable Respiratory status: spontaneous breathing, nonlabored ventilation, respiratory function stable and patient connected to nasal cannula oxygen Cardiovascular status: blood pressure returned to baseline and stable Postop Assessment: no apparent nausea or vomiting Anesthetic complications: no   No notable events documented.   Last Vitals:  Vitals:   02/25/24 1215 02/25/24 1219  BP: 112/87 124/84  Pulse: 87 86  Resp: 13 14  Temp:    SpO2: 94% 93%    Last Pain:  Vitals:   02/25/24 1215  TempSrc:   PainSc: 0-No pain                 Fairy A Liylah Najarro      "

## 2024-02-25 NOTE — Op Note (Signed)
 PREOPERATIVE DIAGNOSIS:  Nuclear sclerotic cataract of the left eye.   POSTOPERATIVE DIAGNOSIS:  Nuclear sclerotic cataract of the left eye.   OPERATIVE PROCEDURE: Phacoemulsification with IOL Implant Left Eye   SURGEON:  Curtistine Fava, MD   ANESTHESIA:  Anesthesiologist: Maggioncalda, Fairy LABOR, MD CRNA: Pearl Ozell PARAS, CRNA  1.      Managed anesthesia care. 2.     0.64ml of epi-Shugarcaine was instilled following the paracentesis   COMPLICATIONS:  None.   TECHNIQUE:   Phacoemulsification divide and conquer   DESCRIPTION OF PROCEDURE:  The patient was examined and consented in the preoperative holding area where the aforementioned topical anesthesia was applied to the left eye and then brought back to the Operating Room where the left eye was prepped and draped in the usual sterile ophthalmic fashion and a lid speculum was placed. A paracentesis was created with the side port blade and the anterior chamber was filled with epi-Shugarcaine follower by viscoelastic. A clear corneal incision was performed with the steel keratome. A continuous curvilinear capsulorrhexis was performed with a cystotome followed by the capsulorrhexis forceps. Hydrodissection and hydrodelineation were carried out with BSS on a blunt cannula. The lens was removed in a divide and conquer technique and the remaining cortical material was removed with the irrigation-aspiration handpiece. The capsular bag was inflated with viscoelastic and the CC60WF +24.00D lens was placed in the capsular bag without complication. The remaining viscoelastic was removed from the eye with the irrigation-aspiration handpiece. The wounds were hydrated. The anterior chamber was flushed with BSS and the eye was inflated to physiologic pressure. 0.18ml of Vigamox  was placed in the anterior chamber. The wounds were found to be water tight. The eye was dressed with Combigan  and covered with a clear shield to be worn until the first postoperative day  appointment. The patient was given protective glasses to wear throughout the day. The patient was also given drops with which to begin a drop regimen today and will follow-up with me in one day. Implant Name Type Inv. Item Serial No. Manufacturer Lot No. LRB No. Used Action  LENS IOL CLRN CLEAR 24.0 - [id number] Intraocular Lens LENS IOL CLRN CLEAR 24.0 [id number] [employer]  Left 1 Implanted    Procedures: PHACOEMULSIFICATION, CATARACT, WITH IOL INSERTION  10.16, 01:01.2 (Left)  Electronically signed: Curtistine PARAS [facility] 02/25/2024 12:13 PM

## 2024-02-25 NOTE — H&P (Signed)
 [facility]   Primary Care Physician:  Gasper Nancyann BRAVO, MD Ophthalmologist: Dr. Curtistine Fava  Pre-Procedure History & Physical: HPI:  Jason Gutierrez is a 59 y.o. male here for cataract surgery.   Past Medical History:  Diagnosis Date   Arthritis    Chronic bilateral low back pain    Chronic bilateral thoracic back pain    Chronic bronchitis (HCC)    Chronic pain syndrome    COPD (chronic obstructive pulmonary disease) (HCC)    COVID-19 02/16/2019   History of pneumonia 10/26/2014   Pulmonary emphysema [facility])     Past Surgical History:  Procedure Laterality Date   CATARACT EXTRACTION W/PHACO Right 02/01/2024   Procedure: PHACOEMULSIFICATION, CATARACT, WITH IOL INSERTION 18.29, 02:01.1;  Surgeon: Fava Curtistine PARAS, MD;  Location: [facility];  Service: Ophthalmology;  Laterality: Right;   Lumbar spine x ray  08/13/2009   Mild DDD L3-L4   MRI Lumbar spine  07/05/2007   small central HNP at C4-C5 effaces anterior cervical cord. Mild encroachment at neural forminal at C5-C6, C6-C7, and C7-T1 due to annular buldge   REPLACEMENT TOTAL KNEE  1980    Prior to Admission medications  Medication Sig Start Date End Date Taking? Authorizing Provider  HYDROcodone-acetaminophen (NORCO) 7.5-325 MG tablet Take 1 tablet by mouth every 6 (six) hours as needed. For pain 02/08/17  Yes [provider]  sildenafil  (VIAGRA ) 100 MG tablet TAKE 1 TABLET BY MOUTH AS DIRECTED, NOT TO EXCEED 1 TABLET PER DAY 12/23/20  Yes Gasper Nancyann BRAVO, MD    Allergies as of 01/14/2024 - Review Complete 06/30/2019  Allergen Reaction Noted   Augmentin  [amoxicillin -pot clavulanate] Diarrhea and Nausea And Vomiting 10/27/2014   Levitra [vardenafil]  10/26/2014   Penicillins  10/26/2014   Vicodin [hydrocodone-acetaminophen]  10/26/2014    Family History  Problem Relation Age of Onset   Osteoporosis Mother    Heart attack Father    Stroke Father    Heart attack Brother 81   Bone cancer  Maternal Uncle    CAD Brother    CAD Brother    CAD Brother     Social History   Socioeconomic History   Marital status: Married    Spouse name: Not on file   Number of children: 3   Years of education: Not on file   Highest education level: Not on file  Occupational History   Occupation: Public Affairs Consultant    Comment: Works at  [employer]  Tobacco Use   Smoking status: Every Day    Current packs/day: 1.00    Average packs/day: 1 pack/day for 44.1 years (44.1 ttl pk-yrs)    Types: Cigarettes    Start date: 1982   Smokeless tobacco: Never  Vaping Use   Vaping status: Never Used  Substance and Sexual Activity   Alcohol use: Not Currently   Drug use: Yes    Types: Marijuana    Comment: when 18   Sexual activity: Not on file  Other Topics Concern   Not on file  Social History Narrative   Not on file   Social Drivers of Health   Tobacco Use: High Risk (01/25/2024)   Received from [facility]   Patient History    Smoking Tobacco Use: Every Day    Smokeless Tobacco Use: Never    Passive Exposure: Not on file  Financial Resource Strain: Low Risk (07/02/2023)   Received from [facility]   Overall Financial Resource Strain (CARDIA)    Difficulty  of Paying Living Expenses: Not hard at all  Food Insecurity: No Food Insecurity (07/02/2023)   Received from [facility]   Epic    Within the past 12 months, you worried that your food would run out before you got the money to buy more.: Never true    Within the past 12 months, the food you bought just didn't last and you didn't have money to get more.: Never true  Transportation Needs: No Transportation Needs (07/02/2023)   Received from [facility]   PRAPARE - Transportation    Lack of Transportation (Medical): No    Lack of Transportation (Non-Medical): No  Physical Activity: Not on file  Stress: Not on file  Social Connections: Not on file  Intimate Partner Violence: Not on file  Depression (EYV7-0): Not on file  Alcohol  Screen: Not on file  Housing: Low Risk (07/02/2023)   Received from [facility]    In the last 12 months, was there a time when you were not able to pay the mortgage or rent on time?: No    In the past 12 months, how many times have you moved where you were living?: 0    At any time in the past 12 months, were you homeless or living in a shelter (including now)?: No  Utilities: Not At Risk (07/02/2023)   Received from [facility] Utilities    Threatened with loss of utilities: No  Health Literacy: Not on file    Review of Systems: See HPI, otherwise negative ROS  Physical Exam: BP 119/84   Pulse 97   Temp (!) 97.3 F (36.3 C) (Temporal)   Resp 14   Ht 5' 8 (1.727 m)   Wt 76.7 kg   SpO2 94%   BMI 25.70 kg/m  General:   Alert, cooperative in NAD Head:  Normocephalic and atraumatic. Respiratory:  Normal work of breathing. Cardiovascular:  RRR  Impression/Plan: [name] is here for cataract surgery LEFT EYE.  Risks, benefits, limitations, and alternatives regarding cataract surgery have been reviewed with the patient.  Questions have been answered.  All parties agreeable.   Curtistine JINNY Fava, MD  02/25/2024, 11:37 AM

## 2024-02-25 NOTE — Transfer of Care (Signed)
 Immediate Anesthesia Transfer of Care Note  Patient: Jason Gutierrez  Procedure(s) Performed: PHACOEMULSIFICATION, CATARACT, WITH IOL INSERTION  10.16, 01:01.2 (Left: Eye)  Patient Location: PACU  Anesthesia Type: MAC  Level of Consciousness: awake, alert  and patient cooperative  Airway and Oxygen Therapy: Patient Spontanous Breathing and Patient connected to supplemental oxygen  Post-op Assessment: Post-op Vital signs reviewed, Patient's Cardiovascular Status Stable, Respiratory Function Stable, Patent Airway and No signs of Nausea or vomiting  Post-op Vital Signs: Reviewed and stable  Complications: No notable events documented.

## 2024-02-25 NOTE — Anesthesia Preprocedure Evaluation (Addendum)
"                                    Anesthesia Evaluation  Patient identified by MRN, date of birth, ID band Patient awake    Reviewed: Allergy & Precautions, H&P , NPO status , Patient's Chart, lab work & pertinent test results  Airway Mallampati: II  TM Distance: >3 FB Neck ROM: Full    Dental no notable dental hx. (+) Poor Dentition   Pulmonary neg pulmonary ROS, Current Smoker   Pulmonary exam normal breath sounds clear to auscultation       Cardiovascular negative cardio ROS Normal cardiovascular exam Rhythm:Regular Rate:Normal     Neuro/Psych negative neurological ROS  negative psych ROS   GI/Hepatic negative GI ROS, Neg liver ROS,,,  Endo/Other  negative endocrine ROS    Renal/GU negative Renal ROS  negative genitourinary   Musculoskeletal negative musculoskeletal ROS (+)    Abdominal   Peds negative pediatric ROS (+)  Hematology negative hematology ROS (+)   Anesthesia Other Findings No evidence of Von willibrand disease, says he has no problem clotting when cut. Was labeled as a child with frequent nose bleeds   Reproductive/Obstetrics negative OB ROS                              Anesthesia Physical Anesthesia Plan  ASA: 2  Anesthesia Plan: MAC   Post-op Pain Management:    Induction: Intravenous  PONV Risk Score and Plan:   Airway Management Planned:   Additional Equipment:   Intra-op Plan:   Post-operative Plan: Extubation in OR  Informed Consent: I have reviewed the patients History and Physical, chart, labs and discussed the procedure including the risks, benefits and alternatives for the proposed anesthesia with the patient or authorized representative who has indicated his/her understanding and acceptance.     Dental advisory given  Plan Discussed with: CRNA  Anesthesia Plan Comments:          Anesthesia Quick Evaluation  "
